# Patient Record
Sex: Female | Born: 2000 | Hispanic: No | Marital: Single | Smoking: Former smoker
Health system: Southern US, Community
[De-identification: ages and names within clinical notes are randomized; demographics above are authoritative.]

## PROBLEM LIST (undated history)

## (undated) DIAGNOSIS — G43909 Migraine, unspecified, not intractable, without status migrainosus: Secondary | ICD-10-CM

## (undated) DIAGNOSIS — T7840XA Allergy, unspecified, initial encounter: Secondary | ICD-10-CM

## (undated) DIAGNOSIS — J45909 Unspecified asthma, uncomplicated: Secondary | ICD-10-CM

## (undated) DIAGNOSIS — F3181 Bipolar II disorder: Secondary | ICD-10-CM

## (undated) DIAGNOSIS — T1491XA Suicide attempt, initial encounter: Secondary | ICD-10-CM

## (undated) HISTORY — PX: FOOT SURGERY: SHX648

## (undated) HISTORY — DX: Unspecified asthma, uncomplicated: J45.909

## (undated) HISTORY — PX: TONSILLECTOMY: SUR1361

## (undated) HISTORY — PX: WISDOM TOOTH EXTRACTION: SHX21

## (undated) HISTORY — DX: Allergy, unspecified, initial encounter: T78.40XA

---
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
## 2018-02-09 ENCOUNTER — Encounter: Payer: Self-pay | Admitting: Family Medicine

## 2018-02-09 ENCOUNTER — Ambulatory Visit (INDEPENDENT_AMBULATORY_CARE_PROVIDER_SITE_OTHER): Payer: Medicaid Other | Admitting: Family Medicine

## 2018-02-09 VITALS — BP 136/78 | Ht 66.0 in | Wt 219.8 lb

## 2018-02-09 DIAGNOSIS — R21 Rash and other nonspecific skin eruption: Secondary | ICD-10-CM

## 2018-02-09 DIAGNOSIS — E282 Polycystic ovarian syndrome: Secondary | ICD-10-CM

## 2018-02-09 DIAGNOSIS — E119 Type 2 diabetes mellitus without complications: Secondary | ICD-10-CM | POA: Diagnosis not present

## 2018-02-09 MED ORDER — TRIAMCINOLONE ACETONIDE 0.1 % EX CREA: TOPICAL_CREAM | CUTANEOUS | 3 refills | Status: DC

## 2018-02-09 NOTE — Progress Notes (Signed)
   Subjective:    Patient ID: Carrie Wright, female    DOB: June 29, 2000, 18 y.o.   MRN: [id number]  HPI  Patient would like to discuss starting birth control pills.  Patient. states she has taken in the past but is currently not on them.  Results for orders placed or performed in visit on 09/16/17  POCT rapid strep A  Result Value Ref Range   Rapid Strep A Screen Negative Negative    Glu nubers still good    Off now using primarily for hormone reg  Patient's cycles are irregular at times heavily at times sleeping for long periods.  Would like him more normalized understandably.  With the prior birth control pill specifically Ortho-Cyclen there was no significant help.  MonoNessa was also given without any substantial help.  Patient would like to try another birth control pill. Pt did not notice any improvement, really did not seem to help this   Pt notes erratic and heavy  periosd   Sexually active but not wanting   Pt is lesbian    Not remembering   Hoping for an alternaitbe  Still on medicai d    Was washing dishes a lot and deloped eczema itchy hands intermittently.  Rash and intermittently.  Has a lot of irritant exposures.  Over-the-counter meds not helping.  Known history of PCOS.  Primary reason patient takes metformin.  Not watching her diet very well.  Plans to work harder on  occa mariquan use   Results for orders placed or performed in visit on 09/16/17  POCT rapid strep A  Result Value Ref Range   Rapid Strep A Screen Negative Negative       Patient is also having problems with her eczema.  Review of Systems Occasional headaches no chest pain no back pain no abdominal pain occasional diminished energy    Objective:   Physical Exam  Alert and oriented, vitals reviewed and stable, NAD ENT-TM's and ext canals WNL bilat via otoscopic exam Soft palate, tonsils and post pharynx WNL via oropharyngeal exam Neck-symmetric, no masses; thyroid  nonpalpable and nontender Pulmonary-no tachypnea or accessory muscle use; Clear without wheezes via auscultation Card--no abnrml murmurs, rhythm reg and rate WNL Carotid pulses symmetric, without bruits Obesity noted And substantial bilateral dermatitis     Assessment & Plan:  Impression 1 PCO S discussed.  Importance of compliance with medications and diet discussed.  Options for birth control pills discussed.  Will initiate.  Hopefully will help cycles.  2.  Hand dermatitis.  Occupational component.  Will add steroid cream.  Follow-up as scheduled  Type 2 diabetes prior A1c stable/diet exercise discussed  Greater than 50% of this 25 minute face to face visit was spent in counseling and discussion and coordination of care regarding the above diagnosis/diagnosies

## 2018-02-11 MED ORDER — ETHYNODIOL DIAC-ETH ESTRADIOL 1-35 MG-MCG PO TABS: 1.0000 | ORAL_TABLET | Freq: Every day | ORAL | 11 refills | Status: DC

## 2018-02-18 ENCOUNTER — Telehealth: Payer: Self-pay | Admitting: Family

## 2018-02-18 DIAGNOSIS — Z719 Counseling, unspecified: Secondary | ICD-10-CM

## 2018-02-18 NOTE — Progress Notes (Signed)
Based on what you shared with me it looks like you have a serious condition that should be evaluated in a face to face office visit.  NOTE: If you entered your credit card information for this eVisit, you will not be charged. You may see a "hold" on your card for the $30 but that hold will drop off and you will not have a charge processed.  If you are having a true medical emergency please call 911.  If you need an urgent face to face visit, [facility] has four urgent care centers for your convenience.  If you need care fast and have a high deductible or no insurance consider:   [email or web address] to reserve your spot online an avoid wait times  [facility] [address], [address] [place], [place] [address] 8 am to 8 pm Monday-Friday 10 am to 4 pm Saturday-Sunday *Across the street from [employer]  [facility]  [address]  [place], [address] 8 am to 5 pm Monday-Friday * In the [facility]   The following sites will take your  insurance:  . [facility]  [phone] Get Driving Directions Find a Provider at this Location  [address] [place], [place] [address] . 10 am to 8 pm Monday-Friday . 12 pm to 8 pm Saturday-Sunday   . [facility]  [phone] Get Driving Directions Find a Provider at this Location  [address], [address] [place], [place] [address] . 8 am to 8 pm Monday-Friday . 9 am to 6 pm Saturday . 11 am to 6 pm Sunday   . [facility] [place]  [phone] Get Driving Directions  [address] [place], [place] [address] . 8 am to 8 pm Monday-Friday . 8 am to 4 pm Saturday-Sunday   Your e-visit answers were reviewed by a board certified advanced clinical practitioner to complete your personal care plan.  Thank you for using e-Visits.  

## 2018-05-13 ENCOUNTER — Ambulatory Visit (INDEPENDENT_AMBULATORY_CARE_PROVIDER_SITE_OTHER): Payer: Medicaid Other | Admitting: Family Medicine

## 2018-05-13 ENCOUNTER — Other Ambulatory Visit: Payer: Self-pay

## 2018-05-13 DIAGNOSIS — H00036 Abscess of eyelid left eye, unspecified eyelid: Secondary | ICD-10-CM | POA: Diagnosis not present

## 2018-05-13 MED ORDER — CEPHALEXIN 500 MG PO CAPS: 500.0000 mg | ORAL_CAPSULE | Freq: Four times a day (QID) | ORAL | 0 refills | Status: DC

## 2018-05-13 NOTE — Progress Notes (Signed)
   Subjective:    Patient ID: Carrie Wright, female    DOB: October 11, 2000, 18 y.o.   MRN: [id number]  HPI Video visit Patient is having swelling and drainage from her left eye. Patient states it happened overnight. Patient reports no other symptoms. Left upper eyelid is swollen tender to the inner aspect some greenish drainage and watery drainage denies high fever or other symptoms currently Virtual Visit via Video Note  I connected with Carrie Wright on 05/13/18 at  3:00 PM EDT by a video enabled telemedicine application and verified that I am speaking with the correct person using two identifiers.  Location: Patient: home Provider: office   I discussed the limitations of evaluation and management by telemedicine and the availability of in person appointments. The patient expressed understanding and agreed to proceed.  History of Present Illness:    Observations/Objective:   Assessment and Plan:   Follow Up Instructions:    I discussed the assessment and treatment plan with the patient. The patient was provided an opportunity to ask questions and all were answered. The patient agreed with the plan and demonstrated an understanding of the instructions.   The patient was advised to call back or seek an in-person evaluation if the symptoms worsen or if the condition fails to improve as anticipated.  I provided 10 minutes of non-face-to-face time during this encounter.      Review of Systems     Objective:   Physical Exam  On visual exam her upper eyelid is swollen does not appear to be red subjectively it is tender on the inner aspect      Assessment & Plan:  Eyelid infection no obvious conjunctivitis should gradually get better it does not appear to be any type of periorbital cellulitis at this point Keflex for the next 7 days warm compresses update [place] if ongoing troubles

## 2018-06-09 ENCOUNTER — Ambulatory Visit (INDEPENDENT_AMBULATORY_CARE_PROVIDER_SITE_OTHER): Payer: Medicaid Other | Admitting: Family Medicine

## 2018-06-09 ENCOUNTER — Other Ambulatory Visit: Payer: Self-pay

## 2018-06-09 DIAGNOSIS — E119 Type 2 diabetes mellitus without complications: Secondary | ICD-10-CM

## 2018-06-09 DIAGNOSIS — E282 Polycystic ovarian syndrome: Secondary | ICD-10-CM | POA: Diagnosis not present

## 2018-06-09 DIAGNOSIS — Z79899 Other long term (current) drug therapy: Secondary | ICD-10-CM

## 2018-06-09 DIAGNOSIS — F3130 Bipolar disorder, current episode depressed, mild or moderate severity, unspecified: Secondary | ICD-10-CM | POA: Diagnosis not present

## 2018-06-09 MED ORDER — METFORMIN HCL 500 MG PO TABS: ORAL_TABLET | ORAL | 1 refills | Status: DC

## 2018-06-09 MED ORDER — CARIPRAZINE HCL 1.5 MG PO CAPS: 1.5000 mg | ORAL_CAPSULE | Freq: Every day | ORAL | 2 refills | Status: DC

## 2018-06-09 NOTE — Progress Notes (Signed)
   Subjective:    Patient ID: Carrie Wright, female    DOB: 11/11/2000, 18 y.o.   MRN: [id number] Audio plus visual  Patient present with multiple concerns. Diabetes  She presents for her follow-up diabetic visit. She has type 2 diabetes mellitus. Risk factors for coronary artery disease include diabetes mellitus. Current diabetic treatment includes oral agent (monotherapy). She is compliant with treatment all of the time. Her weight is stable. She is following a diabetic diet.   Patient states she feels like she is having problems with focus and may have ADHD.  Patient has known history of bipolar disease.  We extended her psychiatric medications as a courtesy last fall because her psychiatrist and been was not accepting Medicaid.  Patient stated by January she would have new provider.  She continues to take medication that we have prescribed.  Now ongoing issues of challenges with attention and focusing.  No suicidal or homicidal thoughts   Patient would also like to discuss getting tested for cushing's disease.  Patient has been reading up on this.  She is fairly convinced that she has it.  Requests testing for it.  Virtual Visit via Video Note  I connected with Carrie Wright on 06/09/18 at  9:30 AM EDT by a video enabled telemedicine application and verified that I am speaking with the correct person using two identifiers.  Location: Patient: home Provider: office  I discussed the limitations of evaluation and management by telemedicine and the availability of in person appointments. The patient expressed understanding and agreed to proceed.  History of Present Illness:    Observations/Objective:   Assessment and Plan:   Follow Up Instructions:    I discussed the assessment and treatment plan with the patient. The patient was provided an opportunity to ask questions and all were answered. The patient agreed with the plan and demonstrated an understanding of the instructions.    The patient was advised to call back or seek an in-person evaluation if the symptoms worsen or if the condition fails to improve as anticipated.  I provided 25 minutes of non-face-to-face time during this encounter.         Review of Systems No vomiting no diarrhea no headache no chest pain    Objective:   Physical Exam  Virtual visit      Assessment & Plan:  Impression 1 type 2 diabetes discussed to maintain same meds diet exercise discussed  2.  PCOS.  Ongoing.  Patient compliant with medication.  Still irregular periods at times.  3.  Bipolar disease.  With extenuating symptomatology.  Long discussion held.  Absolutely needs a psychiatrist we will work on this once again.  Sorry state of affairs that many are not accepting Medicaid but will need to try to get her 1 rationale discussed

## 2018-06-29 ENCOUNTER — Encounter: Payer: Self-pay | Admitting: Family Medicine

## 2018-07-17 IMAGING — DX DG FOREARM 2V*R*
2 series · 2 of 2 positions shown · non-contrast
Comparison: None.

ADDENDUM:
On further review, there is a possible fracture of the RIGHT radial
neck. Recommend dedicated elbow radiographs.
CLINICAL DATA: Fall while skateboarding onto outstretched arms.
Bilateral arm and wrist pain.

EXAM:
RIGHT FOREARM - 2 VIEW

[forearm ap]
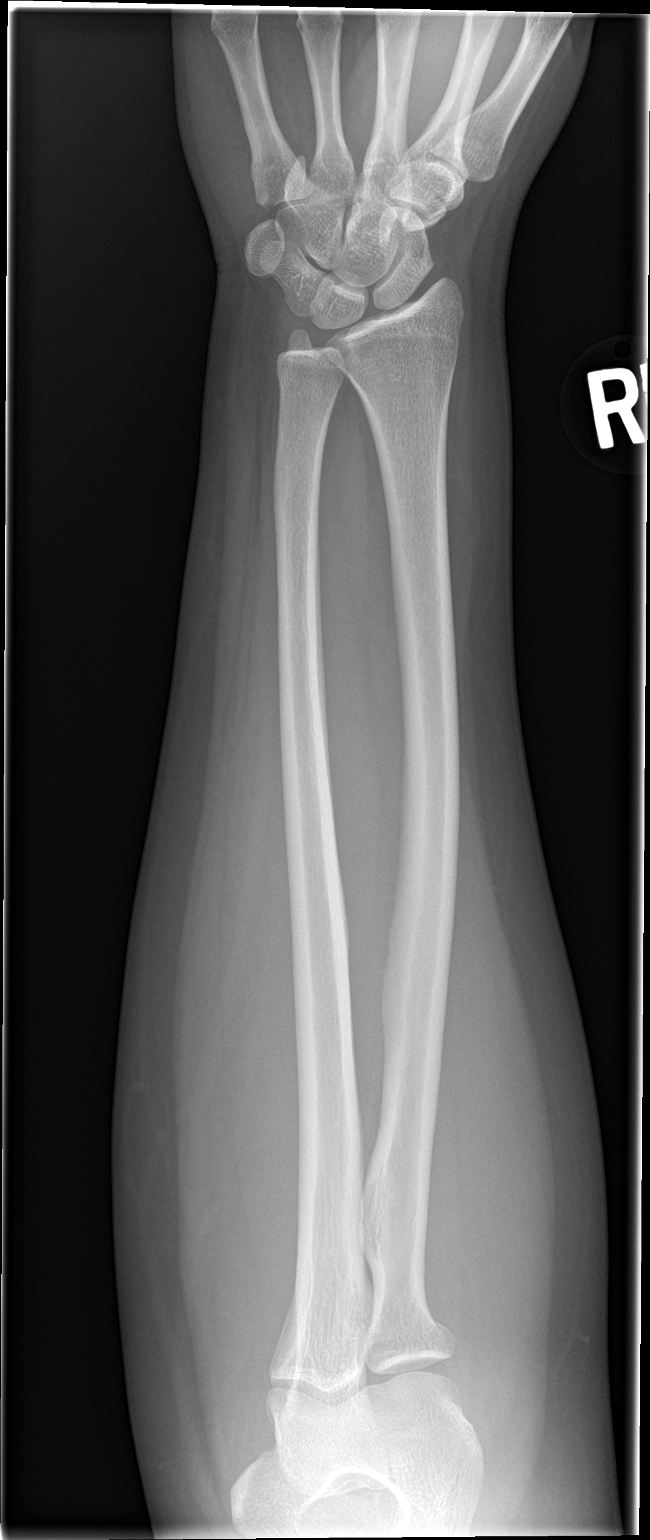

[forearm lat]
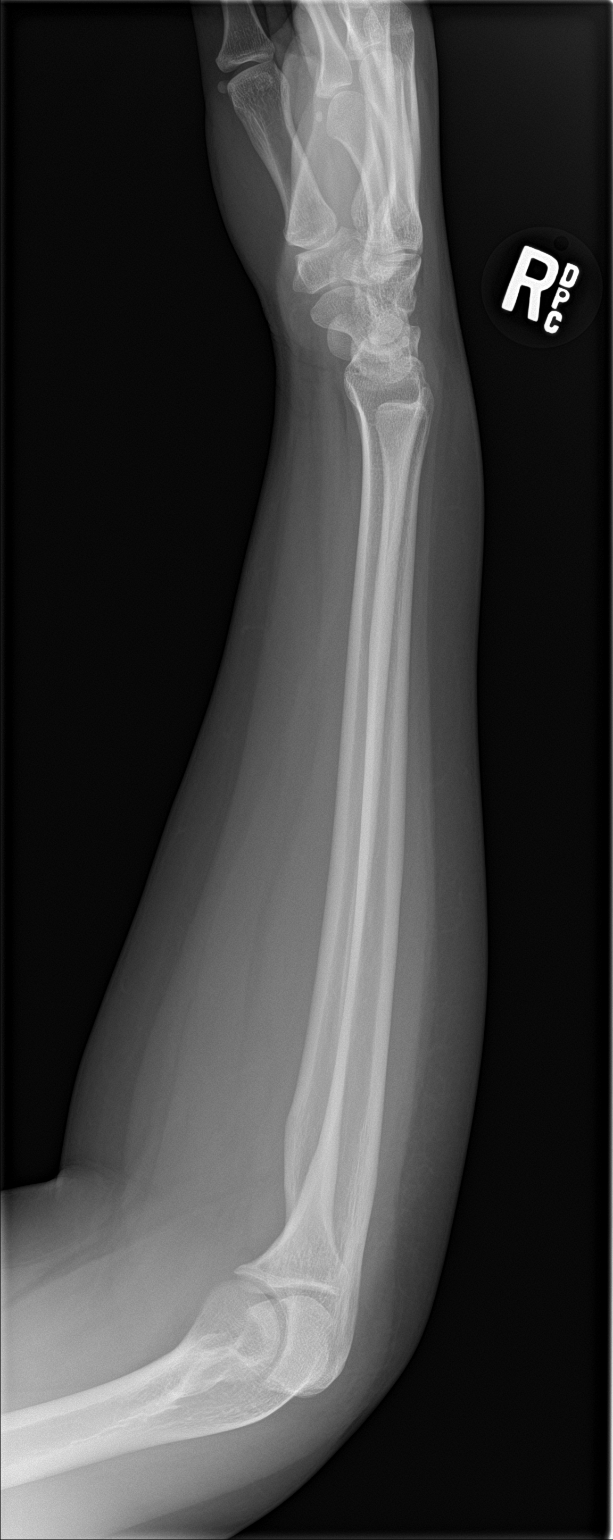

[2 of 2 positions shown; findings below may reference images not displayed]

FINDINGS: Cortical margins of the radius non are intact. There is no evidence
of fracture or other focal bone lesions. Elbow alignment is
maintained. Soft tissues are unremarkable.
IMPRESSION: Negative radiographs of the right forearm.

## 2018-07-17 IMAGING — DX DG WRIST COMPLETE 3+V*R*
4 series · 4 of 4 positions shown · non-contrast
Comparison: None.

CLINICAL DATA: Fall while skateboarding onto both outstretched
arms. Bilateral arm and wrist pain.

EXAM:
RIGHT WRIST - COMPLETE 3+ VIEW

[wrist pa]
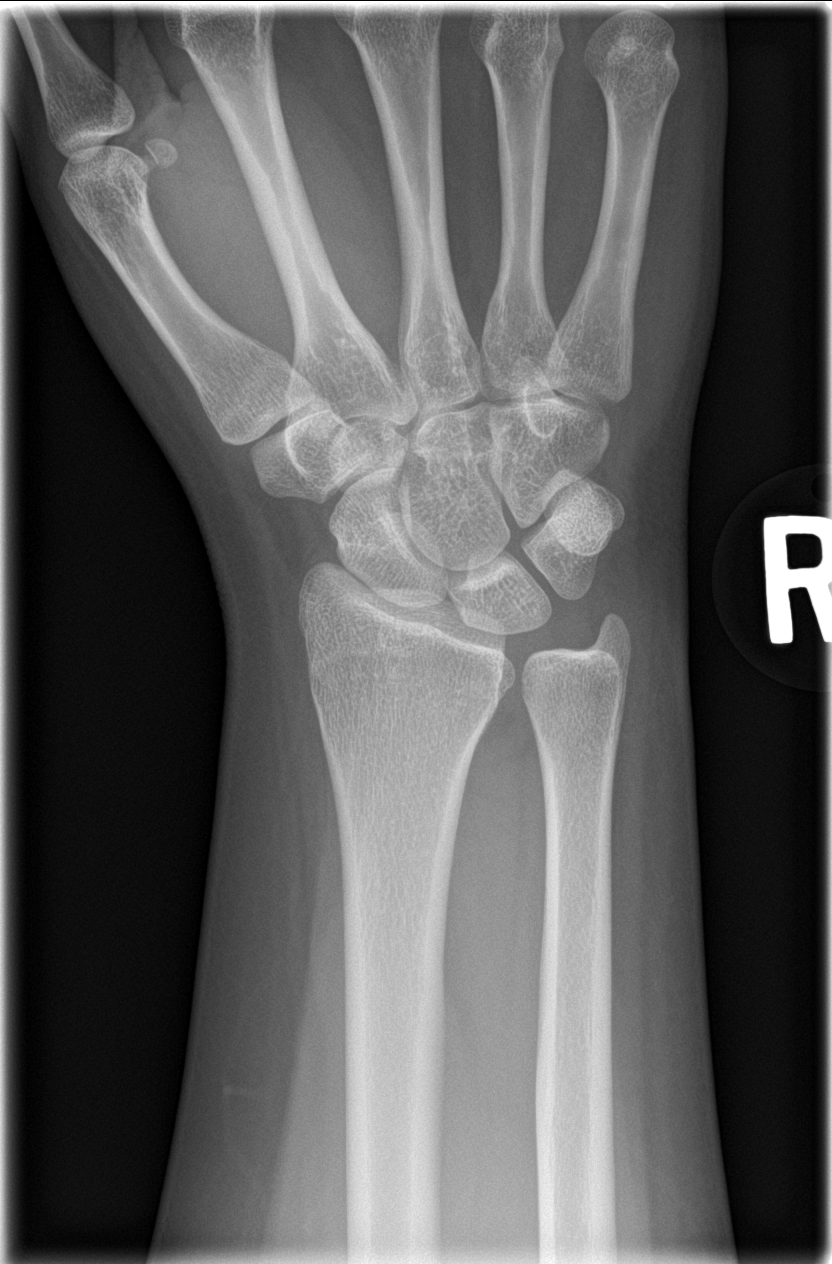

[wrist obl]
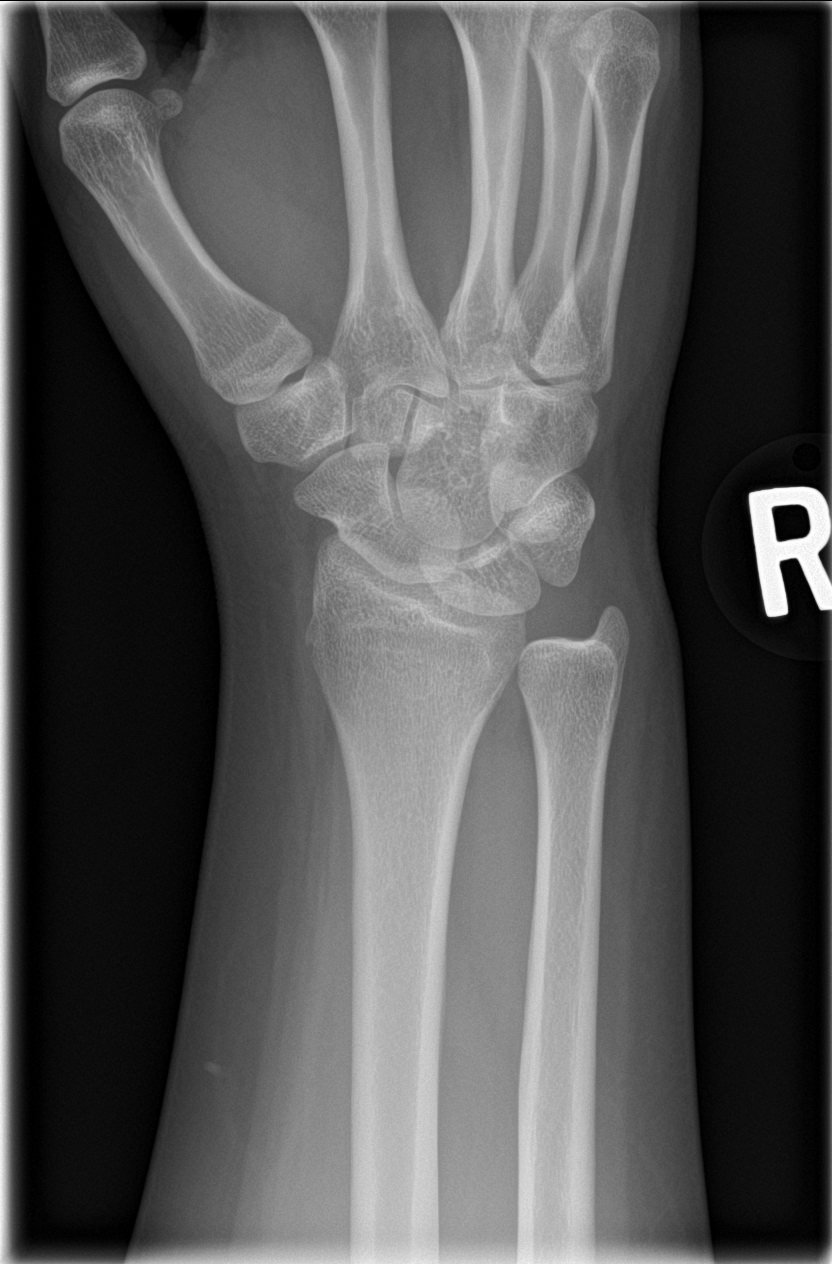

[wrist lat]
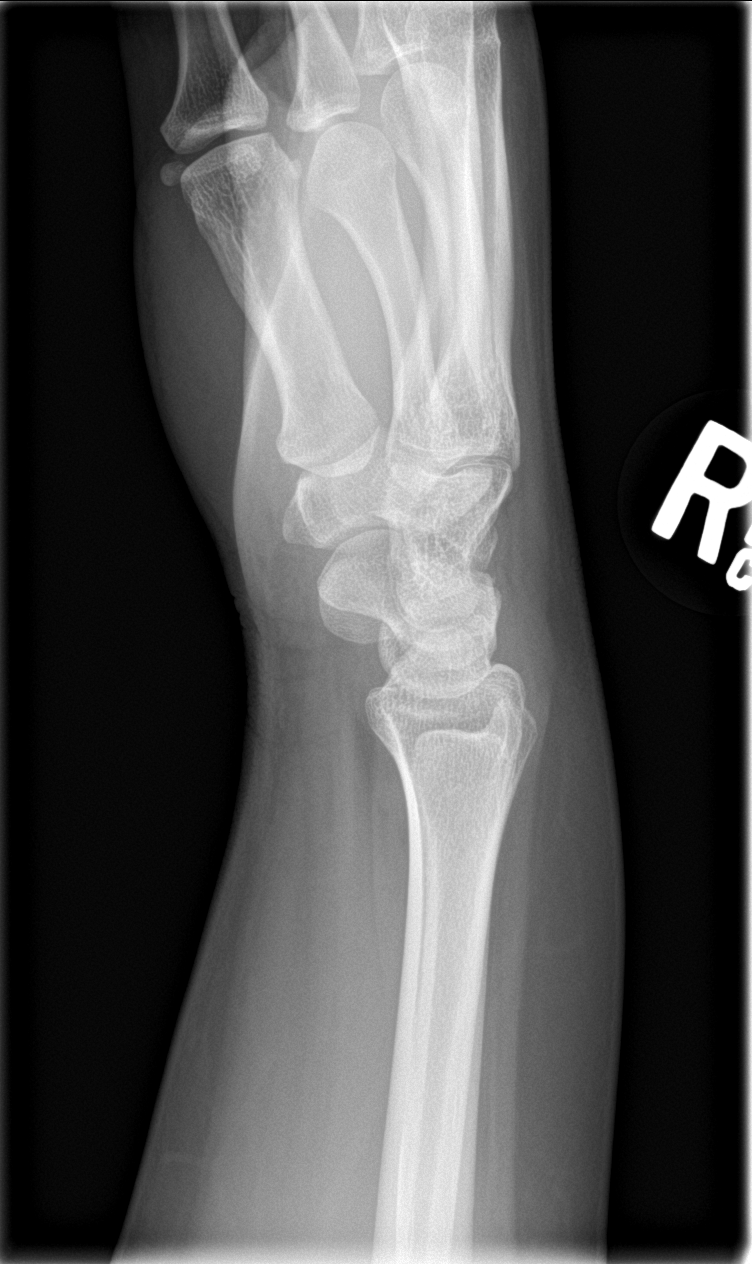

[wrist navicular]
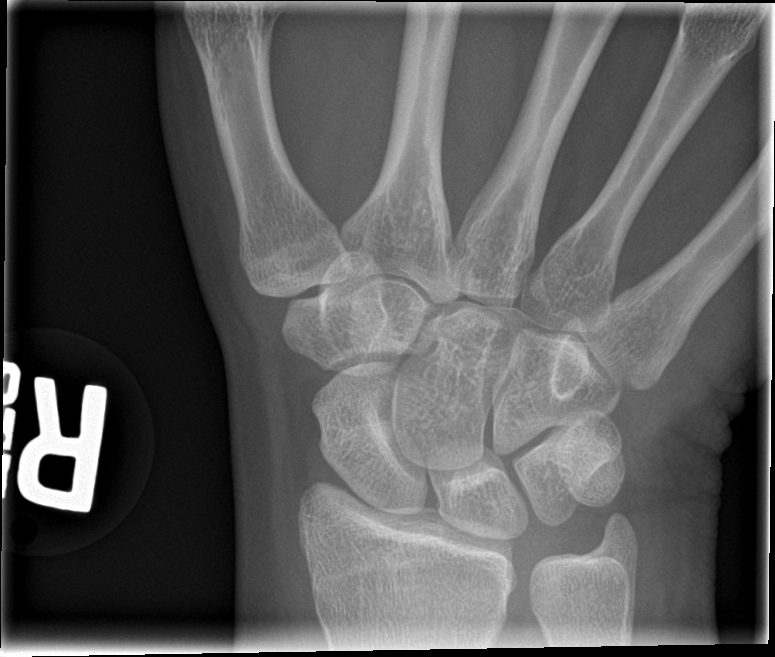

[4 of 4 positions shown; findings below may reference images not displayed]

FINDINGS: There is no evidence of fracture or dislocation. The scaphoid is
intact. There is no evidence of arthropathy or other focal bone
abnormality. Soft tissues are unremarkable.
IMPRESSION: Negative radiographs of the right wrist.

## 2018-08-27 ENCOUNTER — Other Ambulatory Visit: Payer: Self-pay | Admitting: Nurse Practitioner

## 2018-08-27 ENCOUNTER — Encounter: Payer: Self-pay | Admitting: Family Medicine

## 2018-08-30 ENCOUNTER — Other Ambulatory Visit: Payer: Self-pay | Admitting: Nurse Practitioner

## 2018-08-30 MED ORDER — DOXYCYCLINE HYCLATE 100 MG PO TABS: 100.0000 mg | ORAL_TABLET | Freq: Two times a day (BID) | ORAL | 2 refills | Status: DC

## 2018-09-03 ENCOUNTER — Other Ambulatory Visit: Payer: Self-pay

## 2018-09-03 ENCOUNTER — Ambulatory Visit (INDEPENDENT_AMBULATORY_CARE_PROVIDER_SITE_OTHER): Payer: 59 | Admitting: Nurse Practitioner

## 2018-09-03 DIAGNOSIS — F3130 Bipolar disorder, current episode depressed, mild or moderate severity, unspecified: Secondary | ICD-10-CM

## 2018-09-03 DIAGNOSIS — N926 Irregular menstruation, unspecified: Secondary | ICD-10-CM

## 2018-09-03 DIAGNOSIS — E282 Polycystic ovarian syndrome: Secondary | ICD-10-CM

## 2018-09-03 DIAGNOSIS — R69 Illness, unspecified: Secondary | ICD-10-CM | POA: Diagnosis not present

## 2018-09-03 MED ORDER — LO LOESTRIN FE 1 MG-10 MCG / 10 MCG PO TABS: 1.0000 | ORAL_TABLET | Freq: Every day | ORAL | 11 refills | Status: DC

## 2018-09-03 MED ORDER — CARIPRAZINE HCL 1.5 MG PO CAPS: 1.5000 mg | ORAL_CAPSULE | Freq: Every day | ORAL | 2 refills | Status: DC

## 2018-09-03 NOTE — Progress Notes (Signed)
PHONE VISIT Subjective:    Patient ID: Carrie Wright, female    DOB: [birth year], 18 y.o.   MRN: [id number]  HPI pt states she wants to discuss some mental health issues that she thinks may be ADD.  Virtual Visit via Telephone Note  I connected with Carrie Wright on 09/03/18 at  2:40 PM EDT by telephone and verified that I am speaking with the correct person using two identifiers.  Location: Patient: home Provider: office   I discussed the limitations, risks, security and privacy concerns of performing an evaluation and management service by telephone and the availability of in person appointments. I also discussed with the patient that there may be a patient responsible charge related to this service. The patient expressed understanding and agreed to proceed.   History of Present Illness: Presents for recheck on her mental health issues. C/o significant difficulty focusing. Has not been diagnosed with ADD/ADHD in the past. Is being treated for Bipolar I. Was referred to psychiatry in May. They called but patient did not follow up. Doing very well on Vraylar. Had one manic episode about a month ago lasting about 3 days which she attributes to excessive caffeine intake. Otherwise emotions have been stable. Also having irregular cycles. No recent intercourse. Denies possibility of pregnancy. PMH includes PCOS. Weight has fluctuated some; current weight around 214 lbs which is 5 lbs less than last visit.    Observations/Objective: Today's visit was via telephone Physical exam was not possible for this visit Alert, oriented. Thoughts logical, coherent and relevant. Cheerful affect.   Assessment and Plan: Problem List Items Addressed This Visit      Endocrine   PCOS (polycystic ovarian syndrome)     Other   Bipolar I disorder, most recent episode depressed (Carrie Wright) - Primary   Relevant Orders   Ambulatory referral to Psychiatry    Other Visit Diagnoses    Irregular menses           Meds ordered this encounter  Medications  . cariprazine (VRAYLAR) capsule    Sig: Take 1 capsule (1.5 mg total) by mouth daily.    Dispense:  30 capsule    Refill:  2    Order Specific Question:   Supervising Provider    Answer:   Sallee Lange A [[id number]]  . Norethindrone-Ethinyl Estradiol-Fe Biphas (LO LOESTRIN FE) 1 MG-10 MCG / 10 MCG tablet    Sig: Take 1 tablet by mouth daily. Start first Sunday after next cycle begins    Dispense:  1 Package    Refill:  11    Order Specific Question:   Supervising Provider    Answer:   Sallee Lange A [[id number]]     Follow Up Instructions: Will continue Vraylar for now until patient can see psychiatry. Referral resubmitted to system. Start oc's as directed to help regulate cycle. A copy of adult ADD screening was mailed to patient to complete and return to office.  Return for Recheck if no visit with psychiatry within next 3 months.    I discussed the assessment and treatment plan with the patient. The patient was provided an opportunity to ask questions and all were answered. The patient agreed with the plan and demonstrated an understanding of the instructions.   The patient was advised to call back or seek an in-person evaluation if the symptoms worsen or if the condition fails to improve as anticipated.  I provided 15 minutes of non-face-to-face time during this encounter.  Review of Systems     Objective:   Physical Exam        Assessment & Plan:

## 2018-09-04 ENCOUNTER — Encounter: Payer: Self-pay | Admitting: Nurse Practitioner

## 2018-09-06 ENCOUNTER — Encounter: Payer: Self-pay | Admitting: Family Medicine

## 2018-10-11 ENCOUNTER — Encounter: Payer: Self-pay | Admitting: Nurse Practitioner

## 2018-11-08 ENCOUNTER — Encounter (HOSPITAL_COMMUNITY): Payer: Self-pay

## 2018-11-25 ENCOUNTER — Ambulatory Visit (INDEPENDENT_AMBULATORY_CARE_PROVIDER_SITE_OTHER): Payer: 59 | Admitting: Psychiatry

## 2018-11-25 ENCOUNTER — Encounter (HOSPITAL_COMMUNITY): Payer: Self-pay | Admitting: Psychiatry

## 2018-11-25 ENCOUNTER — Other Ambulatory Visit: Payer: Self-pay

## 2018-11-25 DIAGNOSIS — F3130 Bipolar disorder, current episode depressed, mild or moderate severity, unspecified: Secondary | ICD-10-CM | POA: Diagnosis not present

## 2018-11-25 DIAGNOSIS — R69 Illness, unspecified: Secondary | ICD-10-CM | POA: Diagnosis not present
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
## 2018-12-13 ENCOUNTER — Ambulatory Visit (INDEPENDENT_AMBULATORY_CARE_PROVIDER_SITE_OTHER): Payer: 59 | Admitting: Psychiatry

## 2018-12-13 ENCOUNTER — Other Ambulatory Visit: Payer: Self-pay

## 2018-12-13 DIAGNOSIS — F3130 Bipolar disorder, current episode depressed, mild or moderate severity, unspecified: Secondary | ICD-10-CM | POA: Diagnosis not present

## 2018-12-13 DIAGNOSIS — R69 Illness, unspecified: Secondary | ICD-10-CM | POA: Diagnosis not present
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
## 2018-12-20 ENCOUNTER — Ambulatory Visit (HOSPITAL_COMMUNITY): Payer: Medicaid Other | Admitting: Psychiatry

## 2018-12-20 ENCOUNTER — Telehealth (HOSPITAL_COMMUNITY): Payer: Self-pay | Admitting: Psychiatry

## 2018-12-20 ENCOUNTER — Other Ambulatory Visit: Payer: Self-pay

## 2018-12-20 NOTE — Telephone Encounter (Signed)
Therapist attempted to contact patient twice via text message through  [email or web address] platform, no response.  Therapist called patient and left message indicating attempt and requesting patient call the office.

## 2019-02-04 ENCOUNTER — Other Ambulatory Visit: Payer: Self-pay

## 2019-02-04 ENCOUNTER — Ambulatory Visit (INDEPENDENT_AMBULATORY_CARE_PROVIDER_SITE_OTHER): Payer: 59 | Admitting: Psychiatry

## 2019-02-04 DIAGNOSIS — F3130 Bipolar disorder, current episode depressed, mild or moderate severity, unspecified: Secondary | ICD-10-CM | POA: Diagnosis not present

## 2019-02-04 DIAGNOSIS — R69 Illness, unspecified: Secondary | ICD-10-CM | POA: Diagnosis not present
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
## 2019-02-09 ENCOUNTER — Encounter: Payer: Self-pay | Admitting: Family Medicine

## 2019-02-10 ENCOUNTER — Encounter: Payer: Self-pay | Admitting: Family Medicine

## 2019-02-10 ENCOUNTER — Other Ambulatory Visit: Payer: Self-pay

## 2019-02-10 ENCOUNTER — Ambulatory Visit (INDEPENDENT_AMBULATORY_CARE_PROVIDER_SITE_OTHER): Payer: 59 | Admitting: Psychiatry

## 2019-02-10 DIAGNOSIS — R69 Illness, unspecified: Secondary | ICD-10-CM | POA: Diagnosis not present

## 2019-02-10 DIAGNOSIS — F3130 Bipolar disorder, current episode depressed, mild or moderate severity, unspecified: Secondary | ICD-10-CM
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
## 2019-02-23 ENCOUNTER — Other Ambulatory Visit: Payer: Self-pay

## 2019-02-23 ENCOUNTER — Ambulatory Visit (HOSPITAL_COMMUNITY): Payer: 59 | Admitting: Psychiatry

## 2019-03-09 ENCOUNTER — Telehealth (HOSPITAL_COMMUNITY): Payer: Self-pay | Admitting: Psychiatry

## 2019-03-09 ENCOUNTER — Other Ambulatory Visit: Payer: Self-pay

## 2019-03-09 ENCOUNTER — Ambulatory Visit (HOSPITAL_COMMUNITY): Payer: 59 | Admitting: Psychiatry

## 2019-03-09 NOTE — Telephone Encounter (Signed)
Therapist attempted to contact patient twice via text through [email or web address] platform, no response.  Therapist called patient and left message indicating attempt and requesting patient call office. 

## 2019-03-23 ENCOUNTER — Other Ambulatory Visit: Payer: Self-pay

## 2019-03-23 ENCOUNTER — Telehealth (HOSPITAL_COMMUNITY): Payer: Self-pay | Admitting: Psychiatry

## 2019-03-23 ENCOUNTER — Ambulatory Visit (HOSPITAL_COMMUNITY): Payer: 59 | Admitting: Psychiatry

## 2019-03-23 NOTE — Telephone Encounter (Signed)
Therapist called patient for scheduled appointment.  However, patient was in route to work.  Therapist and patient agreed to reschedule appointment.  Therapist also reminded patient of no-show/attendance policy.

## 2019-04-06 ENCOUNTER — Ambulatory Visit (HOSPITAL_COMMUNITY): Payer: 59 | Admitting: Psychiatry

## 2019-04-25 DIAGNOSIS — H5210 Myopia, unspecified eye: Secondary | ICD-10-CM | POA: Diagnosis not present

## 2019-05-04 ENCOUNTER — Telehealth (HOSPITAL_COMMUNITY): Payer: Self-pay | Admitting: Psychiatry

## 2019-05-04 ENCOUNTER — Telehealth (HOSPITAL_COMMUNITY): Payer: 59 | Admitting: Psychiatry

## 2019-05-04 ENCOUNTER — Other Ambulatory Visit: Payer: Self-pay

## 2019-05-04 NOTE — Telephone Encounter (Signed)
Therapist attempted to contact patient twice via text through [employer] platform, no response.  Therapist called patient and left message indicating attempt and requesting patient call office. 

## 2019-06-07 ENCOUNTER — Encounter: Payer: Self-pay | Admitting: Family Medicine

## 2019-06-07 ENCOUNTER — Telehealth (INDEPENDENT_AMBULATORY_CARE_PROVIDER_SITE_OTHER): Payer: 59 | Admitting: Family Medicine

## 2019-06-07 ENCOUNTER — Other Ambulatory Visit: Payer: Self-pay

## 2019-06-07 DIAGNOSIS — J309 Allergic rhinitis, unspecified: Secondary | ICD-10-CM | POA: Diagnosis not present

## 2019-06-07 DIAGNOSIS — J209 Acute bronchitis, unspecified: Secondary | ICD-10-CM

## 2019-06-07 NOTE — Progress Notes (Signed)
Patient ID: Carrie Wright, female    DOB: [birth year], 19 y.o.   MRN: [id number]  Virtual Visit via Telephone Note  I connected with Carrie Wright on 06/08/19 at  3:30 PM EDT by telephone and verified that I am speaking with the correct person using two identifiers.  Location: Patient: home Provider: office   I discussed the limitations, risks, security and privacy concerns of performing an evaluation and management service by telephone and the availability of in person appointments. I also discussed with the patient that there may be a patient responsible charge related to this service. The patient expressed understanding and agreed to proceed.    Chief Complaint  Patient presents with  . Cough    fever, sore throat, congestion since yesterday   Subjective:    HPI  Pt had phone visit concerning for runny nose, fatigue, sleeping a lot, itchy watery eyes.  Using benadryl and wal-phed. In past had h/o asthma and needed inhalers when she would get sick.  Not using them now. Working with the public. Entire family has been sick recently and mom had negative covid test per patient.  Not sure if having a fever, but felt warm yesterday.   Sore throat from coughing.  Small amt of productive cough with light yellow sputum. Coughing is worse at night.   Medical History Carrie Wright has a past medical history of Allergy, Asthma, Bipolar 2 disorder (HCC), Migraine, Migraine, and Suicidal behavior with attempted self-injury (HCC).   Outpatient Encounter Medications as of 06/07/2019  Medication Sig  . albuterol (VENTOLIN HFA) 108 (90 Base) MCG/ACT inhaler Inhale 2 puffs into the lungs every 6 (six) hours as needed for wheezing or shortness of breath (coughing).  . cariprazine (VRAYLAR) capsule Take 1 capsule (1.5 mg total) by mouth daily.  Marland Kitchen doxycycline (VIBRA-TABS) 100 MG tablet Take 1 tablet (100 mg total) by mouth 2 (two) times daily. (Patient not taking: Reported on 11/25/2018)  .  metFORMIN (GLUCOPHAGE) 500 MG tablet Take one tablet daily (Patient not taking: Reported on 11/25/2018)  . Norethindrone-Ethinyl Estradiol-Fe Biphas (LO LOESTRIN FE) 1 MG-10 MCG / 10 MCG tablet Take 1 tablet by mouth daily. Start first Sunday after next cycle begins (Patient not taking: Reported on 11/25/2018)  . predniSONE (DELTASONE) 20 MG tablet Take 2 tablets (40 mg total) by mouth daily with breakfast for 5 days.   No facility-administered encounter medications on file as of 06/07/2019.     Review of Systems  Constitutional: Positive for activity change and fatigue. Negative for chills and fever.  HENT: Positive for postnasal drip, rhinorrhea and sore throat. Negative for congestion, ear pain, sinus pressure and sinus pain.   Eyes: Negative for pain, discharge and itching.  Respiratory: Positive for cough. Negative for shortness of breath and wheezing.   Cardiovascular: Negative for chest pain.  Gastrointestinal: Negative for constipation, diarrhea, nausea and vomiting.     Vitals There were no vitals taken for this visit.  Objective:   Physical Exam  No PE due to phone visit.  Assessment and Plan   1. Bronchospasm with bronchitis, acute - predniSONE (DELTASONE) 20 MG tablet; Take 2 tablets (40 mg total) by mouth daily with breakfast for 5 days.  Dispense: 10 tablet; Refill: 0 - albuterol (VENTOLIN HFA) 108 (90 Base) MCG/ACT inhaler; Inhale 2 puffs into the lungs every 6 (six) hours as needed for wheezing or shortness of breath (coughing).  Dispense: 18 g; Refill: 0  2. Allergic rhinitis, unspecified seasonality, unspecified trigger  Likely allergic rhinitis vs. Viral syndrome. Advising to get covid testing.  Call with results and can given work note and instructions once results are back.  Advising quarantining till results back from covid testing. Symptomatic treatment given for fever and bodyaches with tylenol/ibuprofen and increase in fluids.   Take mucinex or  robitussin for coughing.  Use claritin or allegra for the runny nose and itchy watery eyes. Gave prednisone and albuterol for bronchospasm.  Call back if more SOB, coughing, persistent fever or not improving in next 5-7 days.   If positive for covid, requires quarantining for 10 days from onset of symptoms.  Pt in agreement with plan.    F/u prn.    Follow Up Instructions:    I discussed the assessment and treatment plan with the patient. The patient was provided an opportunity to ask questions and all were answered. The patient agreed with the plan and demonstrated an understanding of the instructions.   The patient was advised to call back or seek an in-person evaluation if the symptoms worsen or if the condition fails to improve as anticipated.  I provided 12 minutes of non-face-to-face time during this encounter.

## 2019-06-08 MED ORDER — ALBUTEROL SULFATE HFA 108 (90 BASE) MCG/ACT IN AERS: 2.0000 | INHALATION_SPRAY | Freq: Four times a day (QID) | RESPIRATORY_TRACT | 0 refills | Status: DC | PRN

## 2019-06-08 MED ORDER — PREDNISONE 20 MG PO TABS: 40.0000 mg | ORAL_TABLET | Freq: Every day | ORAL | 0 refills | Status: AC

## 2019-09-26 ENCOUNTER — Ambulatory Visit
Admission: RE | Admit: 2019-09-26 | Discharge: 2019-09-26 | Disposition: A | Discharge status: Home / Self Care | Payer: Commercial Indemnity | Source: Ambulatory Visit | Attending: Physician Assistant | Admitting: Physician Assistant

## 2019-09-26 ENCOUNTER — Other Ambulatory Visit: Payer: Self-pay

## 2019-09-26 VITALS — BP 135/89 | HR 63 | Temp 99.1°F | Resp 18 | Ht 65.0 in | Wt 210.0 lb

## 2019-09-26 DIAGNOSIS — L03311 Cellulitis of abdominal wall: Secondary | ICD-10-CM

## 2019-09-26 DIAGNOSIS — L02411 Cutaneous abscess of right axilla: Secondary | ICD-10-CM

## 2019-09-26 MED ORDER — MUPIROCIN 2 % EX OINT: 1.0000 "application " | TOPICAL_OINTMENT | Freq: Two times a day (BID) | CUTANEOUS | 0 refills | Status: AC

## 2019-09-26 MED ORDER — DOXYCYCLINE HYCLATE 100 MG PO CAPS: 100.0000 mg | ORAL_CAPSULE | Freq: Two times a day (BID) | ORAL | 0 refills | Status: AC

## 2019-09-26 NOTE — ED Provider Notes (Signed)
[facility]    CSN: [id number] Arrival date & time: 09/26/19  1603      History   Chief Complaint Chief Complaint  Patient presents with  . Appointment  . Abscess    HPI Carrie Wright is a 19 y.o. female.  19 year old female presents for 2 sores that she would like to have evaluated. She has a small open sore of the left lower abdomen that has been there for about a week. She says this area has drained blood and some pus. She said it is shrinking and seems to be getting better. She says a couple days ago she noticed a swollen red area of the right axilla that is tender. She says this lesion is starting similar to the way the abdominal lesion started. Patient says that she has been cleaning the areas with Hibiclens and applying Vaseline and Band-Aids. She denies any fevers she says she has had these lesions in the past, last time was about a year ago. She denies any history of MRSA. There are no other concerns today.  HPI  Past Medical History:  Diagnosis Date  . Allergy   . Asthma   . Bipolar 2 disorder (HCC)   . Migraine   . Migraine   . Suicidal behavior with attempted self-injury [facility])     Patient Active Problem List   Diagnosis Date Noted  . Type 2 diabetes mellitus without complications (HCC) 02/15/2017  . Anaphylaxis 01/15/2017  . Allergic reaction 01/15/2017  . Prediabetes 06/07/2016  . PCOS (polycystic ovarian syndrome) 07/10/2015  . Amenorrhea 07/10/2015  . Bipolar I disorder, most recent episode depressed (HCC) 05/13/2015  . Tylenol overdose 05/11/2015  . Suicide attempt by acetaminophen overdose (HCC) 05/11/2015  . Generalized anxiety disorder 06/29/2014  . Depression 04/04/2013  . Morbid obesity (HCC) 04/04/2013  . Migraine with aura 05/26/2012  . Episodic tension type headache 05/26/2012  . Acquired acanthosis nigricans 05/26/2012  . 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 Migraine without aura 02/25/2012  . Acanthosis  nigricans 02/25/2012  . Migraine, unspecified, without mention of intractable migraine with status migrainosus 02/24/2012    Past Surgical History:  Procedure Laterality Date  . 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 TONSILLECTOMY     2007    OB History   No obstetric history on file.      Home Medications    Prior to Admission medications   Medication Sig Start Date End Date Taking? Authorizing Provider  albuterol (VENTOLIN HFA) 108 (90 Base) MCG/ACT inhaler Inhale 2 puffs into the lungs every 6 (six) hours as needed for wheezing or shortness of breath (coughing). 06/08/19  Yes Ladona Ridgel, Malena M, DO  cariprazine (VRAYLAR) capsule Take 1 capsule (1.5 mg total) by mouth daily. 09/03/18   Campbell Riches, NP  doxycycline (VIBRAMYCIN) 100 MG capsule Take 1 capsule (100 mg total) by mouth 2 (two) times daily for 7 days. 09/26/19 10/03/19  Eusebio Friendly B, PA-C  metFORMIN (GLUCOPHAGE) 500 MG tablet Take one tablet daily Patient not taking: Reported on 11/25/2018 06/09/18   Merlyn Albert, MD  mupirocin ointment (BACTROBAN) 2 % Apply 1 application topically 2 (two) times daily for 7 days. 09/26/19 10/03/19  Eusebio Friendly B, PA-C  Norethindrone-Ethinyl Estradiol-Fe Biphas (LO LOESTRIN FE) 1 MG-10 MCG / 10 MCG tablet Take 1 tablet by mouth daily. Start first Sunday after next cycle begins Patient not taking: Reported on 11/25/2018 09/03/18   Sherie Don  C, NP    Family History Family History  Problem Relation Age of Onset  . Migraines Father   . Migraines Maternal Grandmother   . Cancer Mother   . Migraines Mother   . Depression Mother   . Schizophrenia Maternal Grandfather     Social History Social History   Tobacco Use  . Smoking status: Current Every Day Smoker    Packs/day: 0.25    Years: 3.00    Pack years: 0.75    Types: Cigarettes  . Smokeless tobacco: Never Used  Vaping Use  . Vaping Use: Never used  Substance Use Topics  . Alcohol use: Yes     Alcohol/week: 5.0 standard drinks    Types: 5 Standard drinks or equivalent per week    Comment: occasionally   . Drug use: Yes    Types: Marijuana    Comment: 3.5 grams per week (3 times per week)     Allergies   Gardasil [hpv 4-valent vaccine recombinant vaccine]   Review of Systems Review of Systems  Constitutional: Negative for fatigue and fever.  Gastrointestinal: Negative for abdominal pain, nausea and vomiting.  Musculoskeletal: Negative for arthralgias and myalgias.  Skin: Positive for color change and wound. Negative for rash.  Allergic/Immunologic: Negative for immunocompromised state.  Neurological: Negative for weakness.  Hematological: Negative for adenopathy.     Physical Exam Triage Vital Signs ED Triage Vitals  Enc Vitals Group     BP 09/26/19 1637 135/89     Pulse Rate 09/26/19 1637 63     Resp 09/26/19 1637 18     Temp 09/26/19 1637 99.1 F (37.3 C)     Temp Source 09/26/19 1637 Oral     SpO2 09/26/19 1637 100 %     Weight 09/26/19 1635 210 lb (95.3 kg)     Height 09/26/19 1635 5\' 5"  (1.651 m)     Head Circumference --      Peak Flow --      Pain Score 09/26/19 1634 5     Pain Loc --      Pain Edu? --      Excl. in GC? --    No data found.  Updated Vital Signs BP 135/89 (BP Location: Left Arm)   Pulse 63   Temp 99.1 F (37.3 C) (Oral)   Resp 18   Ht 5\' 5"  (1.651 m)   Wt 210 lb (95.3 kg)   LMP 09/12/2019 (Approximate)   SpO2 100%   BMI 34.95 kg/m        Physical Exam Vitals and nursing note reviewed.  Constitutional:      General: She is not in acute distress.    Appearance: Normal appearance. She is obese. She is not ill-appearing or toxic-appearing.  HENT:     Head: Normocephalic and atraumatic.  Eyes:     General: No scleral icterus.       Right eye: No discharge.        Left eye: No discharge.     Conjunctiva/sclera: Conjunctivae normal.  Cardiovascular:     Rate and Rhythm: Normal rate and regular rhythm.     Heart  sounds: Normal heart sounds.  Pulmonary:     Effort: Pulmonary effort is normal. No respiratory distress.     Breath sounds: Normal breath sounds.  Musculoskeletal:     Cervical back: Neck supple.  Skin:    General: Skin is dry.     Comments: Small indurated erythematous area, approximately 2 cm in  length and 1 cm in width of the left lower abdomen.  There is a central opening but no current drainage.  Area is tender to palpation.  There is no fluctuance.  There is another small approximately 6 mm in diameter erythematous papule of the right axilla that is tender to palpation.  No fluctuance.  No drainage.  Neurological:     General: No focal deficit present.     Mental Status: She is alert. Mental status is at baseline.     Motor: No weakness.     Gait: Gait normal.  Psychiatric:        Mood and Affect: Mood normal.        Behavior: Behavior normal.        Thought Content: Thought content normal.      UC Treatments / Results  Labs (all labs ordered are listed, but only abnormal results are displayed) Labs Reviewed - No data to display  EKG   Radiology No results found.  Procedures Procedures (including critical care time)  Medications Ordered in UC Medications - No data to display  Initial Impression / Assessment and Plan / UC Course  I have reviewed the triage vital signs and the nursing notes.  Pertinent labs & imaging results that were available during my care of the patient were reviewed by me and considered in my medical decision making (see chart for details).   Patient is exam consistent with cellulitis multiple areas.  Prescribed doxycycline to patient.  Also sent mupirocin ointment for her.  Advised patient to keep good wound care and follow-up with our clinic or PCP as needed for any worsening of symptoms or if this is recurrent problem.  Follow-up sooner if there are are any fevers or significant acute worsening.   Final Clinical Impressions(s) / UC  Diagnoses   Final diagnoses:  Cellulitis of abdominal wall  Abscess of right axilla     Discharge Instructions     Take antibiotics as prescribed. Keep the areas clean and dry. In clean with the Hibiclens. You can then apply mupirocin a couple times a day afterwards. Your infection should start to clear up after couple days, but finish the entire prescription of the antibiotic. If at any point you have a fever, more lesions show up or you have increased pain, you should come back for another evaluation. If acute illness and fever, go to the emergency room.    ED Prescriptions    Medication Sig Dispense Auth. Provider   doxycycline (VIBRAMYCIN) 100 MG capsule Take 1 capsule (100 mg total) by mouth 2 (two) times daily for 7 days. 14 capsule Eusebio Friendly B, PA-C   mupirocin ointment (BACTROBAN) 2 % Apply 1 application topically 2 (two) times daily for 7 days. 22 g Shirlee Latch, PA-C     PDMP not reviewed this encounter.   Shirlee Latch, PA-C 09/27/19 1434

## 2019-09-26 NOTE — ED Triage Notes (Signed)
Patient in today c/o abscess on the left side of her abdomen x 1 week and another under her right arm 2-3 days ago and another one mid-abdomen x 1 day. Patient has been cleaning with Hibiclens and applying Vaseline.

## 2019-09-26 NOTE — Discharge Instructions (Signed)
Take antibiotics as prescribed. Keep the areas clean and dry. In clean with the Hibiclens. You can then apply mupirocin a couple times a day afterwards. Your infection should start to clear up after couple days, but finish the entire prescription of the antibiotic. If at any point you have a fever, more lesions show up or you have increased pain, you should come back for another evaluation. If acute illness and fever, go to the emergency room.

## 2020-02-19 ENCOUNTER — Ambulatory Visit (HOSPITAL_COMMUNITY)
Admission: EM | Admit: 2020-02-19 | Discharge: 2020-02-19 | Disposition: A | Discharge status: Home / Self Care | Payer: 59 | Attending: Urgent Care | Admitting: Urgent Care

## 2020-02-19 ENCOUNTER — Other Ambulatory Visit: Payer: Self-pay

## 2020-02-19 ENCOUNTER — Encounter (HOSPITAL_COMMUNITY): Payer: Self-pay

## 2020-02-19 DIAGNOSIS — R059 Cough, unspecified: Secondary | ICD-10-CM | POA: Diagnosis not present

## 2020-02-19 DIAGNOSIS — J45909 Unspecified asthma, uncomplicated: Secondary | ICD-10-CM | POA: Diagnosis not present

## 2020-02-19 DIAGNOSIS — R0602 Shortness of breath: Secondary | ICD-10-CM | POA: Diagnosis present

## 2020-02-19 DIAGNOSIS — Z79899 Other long term (current) drug therapy: Secondary | ICD-10-CM | POA: Insufficient documentation

## 2020-02-19 DIAGNOSIS — B349 Viral infection, unspecified: Secondary | ICD-10-CM | POA: Insufficient documentation

## 2020-02-19 DIAGNOSIS — F3181 Bipolar II disorder: Secondary | ICD-10-CM | POA: Diagnosis not present

## 2020-02-19 DIAGNOSIS — Z20822 Contact with and (suspected) exposure to covid-19: Secondary | ICD-10-CM | POA: Insufficient documentation

## 2020-02-19 DIAGNOSIS — F17210 Nicotine dependence, cigarettes, uncomplicated: Secondary | ICD-10-CM | POA: Diagnosis not present

## 2020-02-19 DIAGNOSIS — J209 Acute bronchitis, unspecified: Secondary | ICD-10-CM | POA: Diagnosis present

## 2020-02-19 LAB — SARS CORONAVIRUS 2 (TAT 6-24 HRS): SARS Coronavirus 2: NEGATIVE

## 2020-02-19 MED ORDER — BENZONATATE 100 MG PO CAPS: 100.0000 mg | ORAL_CAPSULE | Freq: Three times a day (TID) | ORAL | 0 refills | Status: DC | PRN

## 2020-02-19 MED ORDER — ALBUTEROL SULFATE HFA 108 (90 BASE) MCG/ACT IN AERS: 2.0000 | INHALATION_SPRAY | Freq: Four times a day (QID) | RESPIRATORY_TRACT | 0 refills | Status: DC | PRN

## 2020-02-19 MED ORDER — PROMETHAZINE-DM 6.25-15 MG/5ML PO SYRP: 5.0000 mL | ORAL_SOLUTION | Freq: Every evening | ORAL | 0 refills | Status: DC | PRN

## 2020-02-19 NOTE — ED Triage Notes (Addendum)
Pt in with c/o sob and dry cough that has been going on for 1 week now  Pt has not had medication for sxs   Bilateral lung sounds clear

## 2020-02-19 NOTE — ED Provider Notes (Signed)
[facility]   MRN: [id number] DOB: [birth year]  Subjective:   Carrie Wright is a 20 y.o. female presenting for 1 week history of persistent shob, cough, right sided back pain. Has a history of asthma, is not using her inhaler. Has not tried medications for symptoms. Denies fever, chest pain, no urinary symptoms. She is COVID vaccinated. No booster. Not a smoker.   No current facility-administered medications for this encounter.  Current Outpatient Medications:  .  benzonatate (TESSALON) 100 MG capsule, Take 1-2 capsules (100-200 mg total) by mouth 3 (three) times daily as needed., Disp: 60 capsule, Rfl: 0 .  promethazine-dextromethorphan (PROMETHAZINE-DM) 6.25-15 MG/5ML syrup, Take 5 mLs by mouth at bedtime as needed for cough., Disp: 100 mL, Rfl: 0 .  albuterol (VENTOLIN HFA) 108 (90 Base) MCG/ACT inhaler, Inhale 2 puffs into the lungs every 6 (six) hours as needed for wheezing or shortness of breath (coughing)., Disp: 18 g, Rfl: 0 .  cariprazine (VRAYLAR) capsule, Take 1 capsule (1.5 mg total) by mouth daily., Disp: 30 capsule, Rfl: 2 .  metFORMIN (GLUCOPHAGE) 500 MG tablet, Take one tablet daily (Patient not taking: No sig reported), Disp: 90 tablet, Rfl: 1 .  Norethindrone-Ethinyl Estradiol-Fe Biphas (LO LOESTRIN FE) 1 MG-10 MCG / 10 MCG tablet, Take 1 tablet by mouth daily. Start first Sunday after next cycle begins (Patient not taking: Reported on 11/25/2018), Disp: 1 Package, Rfl: 11   Allergies  Allergen Reactions  . Gardasil [Hpv 4-Valent Vaccine Recombinant Vaccine] Rash    Past Medical History:  Diagnosis Date  . Allergy   . Asthma   . Bipolar 2 disorder (HCC)   . Migraine   . Migraine   . Suicidal behavior with attempted self-injury (HCC)      Past Surgical History:  Procedure Laterality Date  . 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 TONSILLECTOMY     18 07    Family History  Problem Relation Age of Onset  . Migraines Father   .  Migraines Maternal Grandmother   . Cancer Mother   . Migraines Mother   . Depression Mother   . Schizophrenia Maternal Grandfather     Social History   Tobacco Use  . Smoking status: Current Every Day Smoker    Packs/day: 0.25    Years: 3.00    Pack years: 0.75    Types: Cigarettes  . Smokeless tobacco: Never Used  Vaping Use  . Vaping Use: Never used  Substance Use Topics  . Alcohol use: Yes    Alcohol/week: 5.0 standard drinks    Types: 5 Standard drinks or equivalent per week    Comment: occasionally   . Drug use: Yes    Types: Marijuana    Comment: 3.5 grams per week (3 times per week)    ROS   Objective:   Vitals: BP 133/71   Pulse 73   Temp 98.7 F (37.1 C)   Resp 17   SpO2 99%   Physical Exam Constitutional:      General: She is not in acute distress.    Appearance: Normal appearance. She is well-developed. She is obese. She is not ill-appearing, toxic-appearing or diaphoretic.  HENT:     Head: Normocephalic and atraumatic.     Nose: Nose normal.     Mouth/Throat:     Mouth: Mucous membranes are moist.  Eyes:     Extraocular Movements: Extraocular movements intact.     Pupils: Pupils  are equal, round, and reactive to light.  Cardiovascular:     Rate and Rhythm: Normal rate and regular rhythm.     Pulses: Normal pulses.     Heart sounds: Normal heart sounds. No murmur heard. No friction rub. No gallop.   Pulmonary:     Effort: Pulmonary effort is normal. No respiratory distress.     Breath sounds: Normal breath sounds. No stridor. No wheezing, rhonchi or rales.  Abdominal:     Tenderness: There is no right CVA tenderness or left CVA tenderness.  Skin:    General: Skin is warm and dry.     Findings: No rash.  Neurological:     Mental Status: She is alert and oriented to person, place, and time.  Psychiatric:        Mood and Affect: Mood normal.        Behavior: Behavior normal.        Thought Content: Thought content normal.         Judgment: Judgment normal.      Assessment and Plan :   PDMP not reviewed this encounter.  1. Cough   2. Shortness of breath   3. Bronchospasm with bronchitis, acute   4. Viral syndrome     Will manage for viral illness such as viral URI, viral syndrome, viral rhinitis, COVID-19. Counseled patient on nature of COVID-19 including modes of transmission, diagnostic testing, management and supportive care.  Offered scripts for symptomatic relief. COVID 19 testing is pending. Counseled patient on potential for adverse effects with medications prescribed/recommended today, ER and return-to-clinic precautions discussed, patient verbalized understanding.     Wallis Bamberg, PA-C 02/19/20 1600

## 2020-02-19 NOTE — Discharge Instructions (Signed)

## 2020-05-23 ENCOUNTER — Encounter: Payer: Self-pay | Admitting: Emergency Medicine

## 2020-05-23 ENCOUNTER — Emergency Department
Admission: EM | Admit: 2020-05-23 | Discharge: 2020-05-24 | Disposition: A | Discharge status: Home / Self Care | Payer: Managed Care, Other (non HMO) | Attending: Emergency Medicine | Admitting: Emergency Medicine

## 2020-05-23 ENCOUNTER — Other Ambulatory Visit: Payer: Self-pay

## 2020-05-23 DIAGNOSIS — J45909 Unspecified asthma, uncomplicated: Secondary | ICD-10-CM | POA: Diagnosis not present

## 2020-05-23 DIAGNOSIS — F17210 Nicotine dependence, cigarettes, uncomplicated: Secondary | ICD-10-CM | POA: Diagnosis not present

## 2020-05-23 DIAGNOSIS — E1169 Type 2 diabetes mellitus with other specified complication: Secondary | ICD-10-CM | POA: Diagnosis not present

## 2020-05-23 DIAGNOSIS — Z7984 Long term (current) use of oral hypoglycemic drugs: Secondary | ICD-10-CM | POA: Diagnosis not present

## 2020-05-23 DIAGNOSIS — R1010 Upper abdominal pain, unspecified: Secondary | ICD-10-CM | POA: Insufficient documentation

## 2020-05-23 DIAGNOSIS — R112 Nausea with vomiting, unspecified: Secondary | ICD-10-CM | POA: Insufficient documentation

## 2020-05-23 LAB — URINALYSIS, COMPLETE (UACMP) WITH MICROSCOPIC
Bilirubin Urine: NEGATIVE
Glucose, UA: NEGATIVE mg/dL
Hgb urine dipstick: NEGATIVE
Ketones, ur: NEGATIVE mg/dL
Leukocytes,Ua: NEGATIVE
Nitrite: NEGATIVE
Protein, ur: NEGATIVE mg/dL
Specific Gravity, Urine: 1.012 (ref 1.005–1.030)
pH: 5 (ref 5.0–8.0)

## 2020-05-23 LAB — LIPASE, BLOOD: Lipase: 25 U/L (ref 11–51)

## 2020-05-23 LAB — COMPREHENSIVE METABOLIC PANEL
ALT: 18 U/L (ref 0–44)
AST: 20 U/L (ref 15–41)
Albumin: 3.9 g/dL (ref 3.5–5.0)
Alkaline Phosphatase: 55 U/L (ref 38–126)
Anion gap: 10 (ref 5–15)
BUN: 10 mg/dL (ref 6–20)
CO2: 21 mmol/L — ABNORMAL LOW (ref 22–32)
Calcium: 8.5 mg/dL — ABNORMAL LOW (ref 8.9–10.3)
Chloride: 106 mmol/L (ref 98–111)
Creatinine, Ser: 0.83 mg/dL (ref 0.44–1.00)
GFR, Estimated: >60 mL/min (ref >60)
Glucose, Bld: 125 mg/dL — ABNORMAL HIGH (ref 70–99)
Potassium: 3.7 mmol/L (ref 3.5–5.1)
Sodium: 137 mmol/L (ref 135–145)
Total Bilirubin: 0.6 mg/dL (ref 0.3–1.2)
Total Protein: 6.8 g/dL (ref 6.5–8.1)

## 2020-05-23 LAB — CBC
HCT: 39.1 % (ref 36.0–46.0)
Hemoglobin: 13.5 g/dL (ref 12.0–15.0)
MCH: 28.5 pg (ref 26.0–34.0)
MCHC: 34.5 g/dL (ref 30.0–36.0)
MCV: 82.5 fL (ref 80.0–100.0)
Platelets: 315 10*3/uL (ref 150–400)
RBC: 4.74 MIL/uL (ref 3.87–5.11)
RDW: 12.5 % (ref 11.5–15.5)
WBC: 10.3 10*3/uL (ref 4.0–10.5)
nRBC: 0 % (ref 0.0–0.2)

## 2020-05-23 LAB — POC URINE PREG, ED: Preg Test, Ur: NEGATIVE

## 2020-05-23 MED ORDER — ONDANSETRON HCL 4 MG/2ML IJ SOLN
4.0000 mg | Freq: Once | INTRAMUSCULAR | Status: AC
  Administered 2020-05-23: 4 mg via INTRAVENOUS
  Filled 2020-05-23: qty 2

## 2020-05-23 MED ORDER — ONDANSETRON 4 MG PO TBDP: 4.0000 mg | ORAL_TABLET | Freq: Three times a day (TID) | ORAL | 0 refills | Status: DC | PRN

## 2020-05-23 MED ORDER — ALUM & MAG HYDROXIDE-SIMETH 200-200-20 MG/5ML PO SUSP
30.0000 mL | Freq: Once | ORAL | Status: AC
  Administered 2020-05-23: 30 mL via ORAL
  Filled 2020-05-23: qty 30

## 2020-05-23 MED ORDER — LIDOCAINE VISCOUS HCL 2 % MT SOLN
15.0000 mL | Freq: Once | OROMUCOSAL | Status: AC
  Administered 2020-05-23: 15 mL via ORAL
  Filled 2020-05-23: qty 15

## 2020-05-23 MED ORDER — FOSFOMYCIN TROMETHAMINE 3 G PO PACK
3.0000 g | PACK | Freq: Once | ORAL | Status: AC
  Administered 2020-05-24: 3 g via ORAL
  Filled 2020-05-23: qty 3

## 2020-05-23 MED ORDER — PANTOPRAZOLE SODIUM 40 MG PO TBEC: 40.0000 mg | DELAYED_RELEASE_TABLET | Freq: Every day | ORAL | 1 refills | Status: DC

## 2020-05-23 NOTE — Discharge Instructions (Signed)
Please take your medication as prescribed each day.  Please use over-the-counter Maalox as needed for discomfort and before going to bed for the next 1 week.  Please call the number provided to arrange an appointment with GI medicine for further evaluation.  Return to the emergency department for any worsening pain, fever, or any other symptom personally concerning to yourself.

## 2020-05-23 NOTE — ED Provider Notes (Signed)
[facility] Emergency Department Provider Note  Time seen: 10:26 PM  I have reviewed the triage vital signs and the nursing notes.   HISTORY  Chief Complaint Abdominal Pain   HPI Carrie Wright is a 20 y.o. female with a past medical history of asthma, bipolar, migraine, presents emergency department for upper abdominal pain.  According to the patient for the past 2 days she has been experiencing pain across the upper abdomen.  States the pain is somewhat relieved when she eats food and then will come back 30 or 40 minutes later.  Patient states very rare alcohol use.  Denies any diarrhea but states she has been nauseated with 1 episode of vomiting today.  No dysuria vaginal bleeding or discharge.  No fever.  No chest pain cough or shortness of breath.   Past Medical History:  Diagnosis Date  . Allergy   . Asthma   . Bipolar 2 disorder (HCC)   . Migraine   . Migraine   . Suicidal behavior with attempted self-injury [facility])     Patient Active Problem List   Diagnosis Date Noted  . Type 2 diabetes mellitus without complications (HCC) 02/15/2017  . Anaphylaxis 01/15/2017  . Allergic reaction 01/15/2017  . Prediabetes 06/07/2016  . PCOS (polycystic ovarian syndrome) 07/10/2015  . Amenorrhea 07/10/2015  . Bipolar I disorder, most recent episode depressed (HCC) 05/13/2015  . Tylenol overdose 05/11/2015  . Suicide attempt by acetaminophen overdose (HCC) 05/11/2015  . Generalized anxiety disorder 06/29/2014  . Depression 04/04/2013  . Morbid obesity (HCC) 04/04/2013  . Migraine with aura 05/26/2012  . Episodic tension type headache 05/26/2012  . Acquired acanthosis nigricans 05/26/2012  . 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 Migraine without aura 02/25/2012  . Acanthosis nigricans 02/25/2012  . Migraine, unspecified, without mention of intractable migraine with status migrainosus 02/24/2012    Past Surgical History:  Procedure  Laterality Date  . 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 TONSILLECTOMY     2007  . WISDOM TOOTH EXTRACTION      Prior to Admission medications   Medication Sig Start Date End Date Taking? Authorizing Provider  albuterol (VENTOLIN HFA) 108 (90 Base) MCG/ACT inhaler Inhale 2 puffs into the lungs every 6 (six) hours as needed for wheezing or shortness of breath (coughing). 02/19/20   Wallis Bamberg, PA-C  benzonatate (TESSALON) 100 MG capsule Take 1-2 capsules (100-200 mg total) by mouth 3 (three) times daily as needed. 02/19/20   Wallis Bamberg, PA-C  cariprazine (VRAYLAR) capsule Take 1 capsule (1.5 mg total) by mouth daily. 09/03/18   Campbell Riches, NP  metFORMIN (GLUCOPHAGE) 500 MG tablet Take one tablet daily Patient not taking: No sig reported 06/09/18   Merlyn Albert, MD  Norethindrone-Ethinyl Estradiol-Fe Biphas (LO LOESTRIN FE) 1 MG-10 MCG / 10 MCG tablet Take 1 tablet by mouth daily. Start first Sunday after next cycle begins Patient not taking: Reported on 11/25/2018 09/03/18   Campbell Riches, NP  promethazine-dextromethorphan (PROMETHAZINE-DM) 6.25-15 MG/5ML syrup Take 5 mLs by mouth at bedtime as needed for cough. 02/19/20   Wallis Bamberg, PA-C    Allergies  Allergen Reactions  . Gardasil [Hpv 4-Valent Vaccine Recombinant Vaccine] Rash    Family History  Problem Relation Age of Onset  . Migraines Father   . Migraines Maternal Grandmother   . Cancer Mother   . Migraines Mother   . Depression Mother   . Schizophrenia Maternal Grandfather  Social History Social History   Tobacco Use  . Smoking status: Current Every Day Smoker    Packs/day: 0.25    Years: 3.00    Pack years: 0.75    Types: Cigarettes  . Smokeless tobacco: Never Used  Vaping Use  . Vaping Use: Never used  Substance Use Topics  . Alcohol use: Yes    Alcohol/week: 5.0 standard drinks    Types: 5 Standard drinks or equivalent per week    Comment: occasionally   . Drug use: Yes    Types:  Marijuana    Comment: 3.5 grams per week (3 times per week)    Review of Systems Constitutional: Negative for fever. Cardiovascular: Negative for chest pain. Respiratory: Negative for shortness of breath. Gastrointestinal: Mild to moderate upper abdominal pain described as dull somewhat improved with eating. Genitourinary: Negative for urinary compaints Musculoskeletal: Negative for musculoskeletal complaints Neurological: Negative for headache All other ROS negative  ____________________________________________   PHYSICAL EXAM:  VITAL SIGNS: ED Triage Vitals  Enc Vitals Group     BP 05/23/20 2213 136/87     Pulse Rate 05/23/20 2213 80     Resp 05/23/20 2213 20     Temp 05/23/20 2213 98.4 F (36.9 C)     Temp Source 05/23/20 2213 Oral     SpO2 05/23/20 2213 100 %     Weight 05/23/20 2210 210 lb (95.3 kg)     Height 05/23/20 2210 5\' 5"  (1.651 m)     Head Circumference --      Peak Flow --      Pain Score 05/23/20 2209 8     Pain Loc --      Pain Edu? --      Excl. in [place]? --     Constitutional: Alert and oriented. Well appearing and in no distress. Eyes: Normal exam ENT      Head: Normocephalic and atraumatic.      Mouth/Throat: Mucous membranes are moist. Cardiovascular: Normal rate, regular rhythm.  Respiratory: Normal respiratory effort without tachypnea nor retractions. Breath sounds are clear  Gastrointestinal: Soft, mild abdominal tenderness across the entire upper abdomen without rebound guarding or distention. Musculoskeletal: Nontender with normal range of motion in all extremities.  Neurologic:  Normal speech and language. No gross focal neurologic deficits Skin:  Skin is warm, dry and intact.  Psychiatric: Mood and affect are normal.   ____________________________________________   INITIAL IMPRESSION / ASSESSMENT AND PLAN / ED COURSE  Pertinent labs & imaging results that were available during my care of the patient were reviewed by me and considered  in my medical decision making (see chart for details).   Patient presents emergency department for upper abdominal pain over the past 2 days.  Patient states nausea with one episode of vomiting.  Describes her pain as mild to moderate dull pain somewhat improved after eating.  Symptoms are very suggestive of possible peptic ulcer disease.  We will check labs to evaluate for biliary disease, pancreatitis.  We will continue to closely monitor.  We will dose a GI cocktail monitor for symptom improvement.  Carrie Wright was evaluated in Emergency Department on 05/23/2020 for the symptoms described in the history of present illness. She was evaluated in the context of the global COVID-19 pandemic, which necessitated consideration that the patient might be at risk for infection with the SARS-CoV-2 virus that causes COVID-19. Institutional protocols and algorithms that pertain to the evaluation of patients at risk for COVID-19 are in  a state of rapid change based on information released by regulatory bodies including the CDC and federal and state organizations. These policies and algorithms were followed during the patient's care in the ED.  ____________________________________________   FINAL CLINICAL IMPRESSION(S) / ED DIAGNOSES  Upper abdominal pain Nausea   Minna Antis, MD 05/23/20 2228

## 2020-05-23 NOTE — ED Triage Notes (Signed)
Patient ambulatory to triage with steady gait, without difficulty or distress noted; pt reports x 2 days having upper abd pain that radiates into back and at times to lower abd accomp by N/V; denies hx of same

## 2020-05-24 NOTE — ED Notes (Signed)
Per MD, patient noted to have one episode of vomiting. Plan for zofran, oral abx, then reassess. Patient and family aware of plan of care and verbalize understanding.

## 2020-05-24 NOTE — ED Notes (Signed)
Patient tolerating fluids without nausea/vomiting.

## 2021-01-29 DIAGNOSIS — R519 Headache, unspecified: Secondary | ICD-10-CM | POA: Diagnosis not present

## 2021-01-29 DIAGNOSIS — J069 Acute upper respiratory infection, unspecified: Secondary | ICD-10-CM | POA: Diagnosis not present

## 2021-01-29 DIAGNOSIS — Z20822 Contact with and (suspected) exposure to covid-19: Secondary | ICD-10-CM | POA: Diagnosis not present

## 2021-01-31 ENCOUNTER — Ambulatory Visit: Payer: 59 | Admitting: Family Medicine

## 2021-02-02 ENCOUNTER — Encounter (HOSPITAL_BASED_OUTPATIENT_CLINIC_OR_DEPARTMENT_OTHER): Payer: Self-pay

## 2021-02-02 ENCOUNTER — Ambulatory Visit (HOSPITAL_COMMUNITY)
Admission: EM | Admit: 2021-02-02 | Discharge: 2021-02-02 | Disposition: A | Discharge status: Home / Self Care | Payer: Managed Care, Other (non HMO)

## 2021-02-02 ENCOUNTER — Other Ambulatory Visit: Payer: Self-pay

## 2021-02-02 ENCOUNTER — Encounter (HOSPITAL_COMMUNITY): Payer: Self-pay | Admitting: *Deleted

## 2021-02-02 ENCOUNTER — Emergency Department (HOSPITAL_BASED_OUTPATIENT_CLINIC_OR_DEPARTMENT_OTHER)
Admission: EM | Admit: 2021-02-02 | Discharge: 2021-02-03 | Disposition: A | Discharge status: Home / Self Care | Payer: Managed Care, Other (non HMO) | Attending: Emergency Medicine | Admitting: Emergency Medicine

## 2021-02-02 DIAGNOSIS — J45909 Unspecified asthma, uncomplicated: Secondary | ICD-10-CM | POA: Diagnosis not present

## 2021-02-02 DIAGNOSIS — R0602 Shortness of breath: Secondary | ICD-10-CM

## 2021-02-02 DIAGNOSIS — U071 COVID-19: Secondary | ICD-10-CM | POA: Insufficient documentation

## 2021-02-02 DIAGNOSIS — R519 Headache, unspecified: Secondary | ICD-10-CM | POA: Diagnosis not present

## 2021-02-02 LAB — RESP PANEL BY RT-PCR (FLU A&B, COVID) ARPGX2
Influenza A by PCR: NEGATIVE
Influenza B by PCR: NEGATIVE
SARS Coronavirus 2 by RT PCR: POSITIVE — AB

## 2021-02-02 NOTE — ED Provider Notes (Signed)
[facility]    CSN: [id number] Arrival date & time: 02/02/21  1712      History   Chief Complaint Chief Complaint  Patient presents with   Headache   Fever   Shortness of Breath    HPI Carrie Wright is a 21 y.o. female.   HPI  SOB: Patient states that she has had shortness of breath for the past week.  She states that this shortness of breath is very different from any shortness of breath that she has had before.  She states that this does not feel like a cold or like an asthma flare.  She states that she is not able to do her normal activities due to the shortness of breath.  She does report recent COVID-19 exposure and did have some cold symptoms early on in the week but she states that this shortness of breath is very different.  She has had some chest pains off and on.  No known calf pain.  She does have a family history of clotting diseases.  No recent procedures or long flights.  She is on birth control.  Past Medical History:  Diagnosis Date   Allergy    Asthma    Bipolar 2 disorder ([place])    Migraine    Migraine    Suicidal behavior with attempted self-injury [facility])     Patient Active Problem List   Diagnosis Date Noted   Type 2 diabetes mellitus without complications ([place]) 0000000   Anaphylaxis 01/15/2017   Allergic reaction 01/15/2017   Prediabetes 06/07/2016   PCOS (polycystic ovarian syndrome) 07/10/2015   Amenorrhea 07/10/2015   Bipolar I disorder, most recent episode depressed ([place]) 05/13/2015   Tylenol overdose 05/11/2015   Suicide attempt by acetaminophen overdose ([place]) 05/11/2015   Generalized anxiety disorder 06/29/2014   Depression 04/04/2013   Morbid obesity ([place]) 04/04/2013   Migraine with aura 05/26/2012   Episodic tension type headache 05/26/2012   Acquired acanthosis nigricans 05/26/2012   Reactive airways dysfunction syndrome ([place]) 05/19/2012   Migraine without aura 02/25/2012   Acanthosis nigricans 02/25/2012   Migraine,  unspecified, without mention of intractable migraine with status migrainosus 02/24/2012    Past Surgical History:  Procedure Laterality Date   FOOT SURGERY     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      Home Medications    Prior to Admission medications   Medication Sig Start Date End Date Taking? Authorizing Provider  albuterol (VENTOLIN HFA) 108 (90 Base) MCG/ACT inhaler Inhale 2 puffs into the lungs every 6 (six) hours as needed for wheezing or shortness of breath (coughing). 02/19/20   Jaynee Eagles, PA-C  benzonatate (TESSALON) 100 MG capsule Take 1-2 capsules (100-200 mg total) by mouth 3 (three) times daily as needed. 02/19/20   Jaynee Eagles, PA-C  cariprazine (VRAYLAR) capsule Take 1 capsule (1.5 mg total) by mouth daily. 09/03/18   Nilda Simmer, NP  metFORMIN (GLUCOPHAGE) 500 MG tablet Take one tablet daily Patient not taking: No sig reported 06/09/18   Mikey Kirschner, MD  Norethindrone-Ethinyl Estradiol-Fe Biphas (LO LOESTRIN FE) 1 MG-10 MCG / 10 MCG tablet Take 1 tablet by mouth daily. Start first Sunday after next cycle begins Patient not taking: Reported on 11/25/2018 09/03/18   Nilda Simmer, NP  ondansetron (ZOFRAN ODT) 4 MG disintegrating tablet Take 1  tablet (4 mg total) by mouth every 8 (eight) hours as needed for nausea or vomiting. 05/23/20   Harvest Dark, MD  pantoprazole (PROTONIX) 40 MG tablet Take 1 tablet (40 mg total) by mouth daily. 05/23/20 07/22/20  Harvest Dark, MD  promethazine-dextromethorphan (PROMETHAZINE-DM) 6.25-15 MG/5ML syrup Take 5 mLs by mouth at bedtime as needed for cough. 02/19/20   Jaynee Eagles, PA-C    Family History Family History  Problem Relation Age of Onset   Migraines Father    Migraines Maternal Grandmother    Cancer Mother    Migraines Mother    Depression Mother    Schizophrenia Maternal Grandfather     Social  History Social History   Tobacco Use   Smoking status: Every Day    Packs/day: 0.25    Years: 3.00    Pack years: 0.75    Types: Cigarettes   Smokeless tobacco: Never  Vaping Use   Vaping Use: Never used  Substance Use Topics   Alcohol use: Yes    Alcohol/week: 5.0 standard drinks    Types: 5 Standard drinks or equivalent per week    Comment: occasionally    Drug use: Yes    Types: Marijuana    Comment: 3.5 grams per week (3 times per week)     Allergies   Gardasil [hpv 4-valent vaccine recombinant vaccine]   Review of Systems Review of Systems  As stated above in HPI Physical Exam Triage Vital Signs ED Triage Vitals  Enc Vitals Group     BP 02/02/21 1806 (!) 145/83     Pulse Rate 02/02/21 1806 (!) 56     Resp 02/02/21 1806 18     Temp 02/02/21 1806 98.5 F (36.9 C)     Temp src --      SpO2 02/02/21 1806 98 %     Weight --      Height --      Head Circumference --      Peak Flow --      Pain Score 02/02/21 1802 10     Pain Loc --      Pain Edu? --      Excl. in [place]? --    No data found.  Updated Vital Signs BP (!) 145/83    Pulse (!) 56    Temp 98.5 F (36.9 C)    Resp 18    SpO2 98%    Physical Exam Vitals and nursing note reviewed.  Constitutional:      General: She is not in acute distress.    Appearance: She is well-developed. She is not ill-appearing, toxic-appearing or diaphoretic.  HENT:     Head: Normocephalic and atraumatic.     Mouth/Throat:     Mouth: Mucous membranes are moist.     Pharynx: Oropharynx is clear.  Eyes:     Extraocular Movements: Extraocular movements intact.     Pupils: Pupils are equal, round, and reactive to light.  Cardiovascular:     Rate and Rhythm: Normal rate and regular rhythm.  Pulmonary:     Effort: Pulmonary effort is normal.     Breath sounds: Normal breath sounds. No decreased breath sounds, wheezing, rhonchi or rales.  Musculoskeletal:     Cervical back: Normal range of motion and neck supple.   Lymphadenopathy:     Cervical: No cervical adenopathy.  Skin:    General: Skin is warm.     Capillary Refill: Capillary refill takes less than 2 seconds.  Coloration: Skin is not cyanotic or pale.     Findings: No erythema.  Neurological:     Mental Status: She is alert and oriented to person, place, and time.     UC Treatments / Results  Labs (all labs ordered are listed, but only abnormal results are displayed) Labs Reviewed - No data to display  EKG   Radiology No results found.  Procedures Procedures (including critical care time)  Medications Ordered in UC Medications - No data to display  Initial Impression / Assessment and Plan / UC Course  I have reviewed the triage vital signs and the nursing notes.  Pertinent labs & imaging results that were available during my care of the patient were reviewed by me and considered in my medical decision making (see chart for details).     New.  Given what she is describing very well could be viral in nature however given her increased risk for PE I have recommended that she go to an ER for D-dimer testing.  She is agreeable. Her friend will transport her.  Final Clinical Impressions(s) / UC Diagnoses   Final diagnoses:  None   Discharge Instructions   None    ED Prescriptions   None    PDMP not reviewed this encounter.   Hughie Closs, [place] 02/02/21 1821

## 2021-02-02 NOTE — ED Notes (Signed)
Patient is being discharged from the Urgent Care and sent to the Emergency Department via POV . Per Clent Jacks, PA, patient is in need of higher level of care due to resources to rule out PE. Patient is aware and verbalizes understanding of plan of care.  Vitals:   02/02/21 1806  BP: (!) 145/83  Pulse: (!) 56  Resp: 18  Temp: 98.5 F (36.9 C)  SpO2: 98%

## 2021-02-02 NOTE — ED Triage Notes (Signed)
Patient here POV from Home with Flu-Like Symptoms.  Patient states she has had close Contact with Significant Other who has been sick. Patient has had Congestion, Headache, and Fever for approximately 3-4 days.  Temp: 102.5 on Thursday  NAD Noted during Triage. A&Ox4. GCS 15. Ambulatory.

## 2021-02-02 NOTE — ED Triage Notes (Signed)
Pt reports that for one week has had SHOB,HA and nausea.

## 2021-02-03 NOTE — Discharge Instructions (Signed)
Please follow-up with your primary care doctor.  Come back to ER if you develop difficulty breathing or other new concerning symptom.  Follow CDC precautions regarding isolation.

## 2021-02-03 NOTE — ED Provider Notes (Signed)
[facility] EMERGENCY DEPT Provider Note   CSN: [id number] Arrival date & time: 02/02/21  1855     History  Chief Complaint  Patient presents with   Headache    Carrie Wright is a 21 y.o. female.  Presents to ER with concern for flulike symptoms.  Patient states that she started having chills, may be fever on Wednesday.  Fever on Thursday was up to 102.5.  Her significant other has COVID.  She states that she is still been having mild to moderate frontal headaches.  Also having some nasal congestion.  Currently her only symptom is a mild headache.  She denies any chronic medical problems.  Reports she had asthma in childhood but does not take any inhalers on a regular basis.  HPI     Home Medications Prior to Admission medications   Medication Sig Start Date End Date Taking? Authorizing Provider  albuterol (VENTOLIN HFA) 108 (90 Base) MCG/ACT inhaler Inhale 2 puffs into the lungs every 6 (six) hours as needed for wheezing or shortness of breath (coughing). 02/19/20   Wallis Bamberg, PA-C  benzonatate (TESSALON) 100 MG capsule Take 1-2 capsules (100-200 mg total) by mouth 3 (three) times daily as needed. 02/19/20   Wallis Bamberg, PA-C  cariprazine (VRAYLAR) capsule Take 1 capsule (1.5 mg total) by mouth daily. 09/03/18   Campbell Riches, NP  metFORMIN (GLUCOPHAGE) 500 MG tablet Take one tablet daily Patient not taking: No sig reported 06/09/18   Merlyn Albert, MD  Norethindrone-Ethinyl Estradiol-Fe Biphas (LO LOESTRIN FE) 1 MG-10 MCG / 10 MCG tablet Take 1 tablet by mouth daily. Start first Sunday after next cycle begins Patient not taking: Reported on 11/25/2018 09/03/18   Campbell Riches, NP  ondansetron (ZOFRAN ODT) 4 MG disintegrating tablet Take 1 tablet (4 mg total) by mouth every 8 (eight) hours as needed for nausea or vomiting. 05/23/20   Minna Antis, MD  pantoprazole (PROTONIX) 40 MG tablet Take 1 tablet (40 mg total) by mouth daily. 05/23/20 07/22/20   Minna Antis, MD  promethazine-dextromethorphan (PROMETHAZINE-DM) 6.25-15 MG/5ML syrup Take 5 mLs by mouth at bedtime as needed for cough. 02/19/20   Wallis Bamberg, PA-C      Allergies    Gardasil [hpv 4-valent vaccine recombinant vaccine]    Review of Systems   Review of Systems  Constitutional:  Positive for fever.  Neurological:  Positive for headaches.  Wright other systems reviewed and are negative.  Physical Exam Updated Vital Signs BP 136/66 (BP Location: Right Arm)    Pulse (!) 55    Temp 98.1 F (36.7 C) (Oral)    Resp 16    Ht 5\' 5"  (1.651 m)    Wt 99.8 kg    SpO2 100%    BMI 36.61 kg/m  Physical Exam Vitals and nursing note reviewed.  Constitutional:      General: She is not in acute distress.    Appearance: She is well-developed.  HENT:     Head: Normocephalic and atraumatic.  Eyes:     Conjunctiva/sclera: Conjunctivae normal.  Cardiovascular:     Rate and Rhythm: Normal rate and regular rhythm.     Heart sounds: No murmur heard. Pulmonary:     Effort: Pulmonary effort is normal. No respiratory distress.     Breath sounds: Normal breath sounds.  Abdominal:     Palpations: Abdomen is soft.     Tenderness: There is no abdominal tenderness.  Musculoskeletal:  General: No swelling.     Cervical back: Neck supple.  Skin:    General: Skin is warm and dry.     Capillary Refill: Capillary refill takes less than 2 seconds.  Neurological:     Mental Status: She is alert.  Psychiatric:        Mood and Affect: Mood normal.    ED Results / Procedures / Treatments   Labs (Wright labs ordered are listed, but only abnormal results are displayed) Labs Reviewed  RESP PANEL BY RT-PCR (FLU A&B, COVID) ARPGX2 - Abnormal; Notable for the following components:      Result Value   SARS Coronavirus 2 by RT PCR POSITIVE (*)    Wright other components within normal limits    EKG None  Radiology No results found.  Procedures Procedures    Medications Ordered in  ED Medications - No data to display  ED Course/ Medical Decision Making/ A&P                           Medical Decision Making  21 year old girl presents to ER with concern for flulike symptoms.  On exam she appears remarkably well in no distress.  Lungs are clear to auscultation, no tachypnea or hypoxia, doubt pneumonia.  Do not feel she needs chest imaging today based on exam and history.  COVID test is positive, suspect this is culprit for her symptoms today.  As patient has had symptoms for a few days now and has no significant medical comorbidities, do not feel she would need course of paxlovid. Discussed risks/benefits of this medication with patient and she declined.  Recommended follow-up with primary care doctor.  Discussed isolation precautions.  Carrie Wright was evaluated in Emergency Department on 02/03/2021 for the symptoms described in the history of present illness. She was evaluated in the context of the global COVID-19 pandemic, which necessitated consideration that the patient might be at risk for infection with the SARS-CoV-2 virus that causes COVID-19. Institutional protocols and algorithms that pertain to the evaluation of patients at risk for COVID-19 are in a state of rapid change based on information released by regulatory bodies including the CDC and federal and state organizations. These policies and algorithms were followed during the patient's care in the ED.   After the discussed management above, the patient was determined to be safe for discharge.  The patient was in agreement with this plan and Wright questions regarding their care were answered.  ED return precautions were discussed and the patient will return to the ED with any significant worsening of condition.         Final Clinical Impression(s) / ED Diagnoses Final diagnoses:  COVID-19    Rx / DC Orders ED Discharge Orders     None         Milagros Loll, MD 02/03/21 [phone]

## 2021-04-30 ENCOUNTER — Ambulatory Visit (INDEPENDENT_AMBULATORY_CARE_PROVIDER_SITE_OTHER): Payer: BC Managed Care – PPO | Admitting: Family Medicine

## 2021-04-30 ENCOUNTER — Ambulatory Visit (HOSPITAL_COMMUNITY)
Admission: RE | Admit: 2021-04-30 | Discharge: 2021-04-30 | Disposition: A | Discharge status: Home / Self Care | Payer: BC Managed Care – PPO | Source: Ambulatory Visit | Attending: Family Medicine | Admitting: Family Medicine

## 2021-04-30 ENCOUNTER — Ambulatory Visit: Payer: Self-pay | Admitting: Family Medicine

## 2021-04-30 ENCOUNTER — Encounter: Payer: Self-pay | Admitting: Family Medicine

## 2021-04-30 VITALS — BP 144/90 | HR 65 | Temp 98.0°F | Ht 65.0 in | Wt 218.8 lb

## 2021-04-30 DIAGNOSIS — R519 Headache, unspecified: Secondary | ICD-10-CM | POA: Diagnosis not present

## 2021-04-30 DIAGNOSIS — Z130 Encounter for screening for diseases of the blood and blood-forming organs and certain disorders involving the immune mechanism: Secondary | ICD-10-CM

## 2021-04-30 DIAGNOSIS — E119 Type 2 diabetes mellitus without complications: Secondary | ICD-10-CM

## 2021-04-30 DIAGNOSIS — J4540 Moderate persistent asthma, uncomplicated: Secondary | ICD-10-CM | POA: Diagnosis not present

## 2021-04-30 DIAGNOSIS — H4710 Unspecified papilledema: Secondary | ICD-10-CM | POA: Diagnosis not present

## 2021-04-30 DIAGNOSIS — H9319 Tinnitus, unspecified ear: Secondary | ICD-10-CM | POA: Insufficient documentation

## 2021-04-30 DIAGNOSIS — L209 Atopic dermatitis, unspecified: Secondary | ICD-10-CM | POA: Insufficient documentation

## 2021-04-30 DIAGNOSIS — Z87892 Personal history of anaphylaxis: Secondary | ICD-10-CM | POA: Insufficient documentation

## 2021-04-30 DIAGNOSIS — J45909 Unspecified asthma, uncomplicated: Secondary | ICD-10-CM | POA: Insufficient documentation

## 2021-04-30 DIAGNOSIS — L309 Dermatitis, unspecified: Secondary | ICD-10-CM

## 2021-04-30 MED ORDER — GADOBUTROL 1 MMOL/ML IV SOLN
9.0000 mL | Freq: Once | INTRAVENOUS | Status: AC | PRN
  Administered 2021-04-30: 9 mL via INTRAVENOUS

## 2021-04-30 MED ORDER — ALBUTEROL SULFATE HFA 108 (90 BASE) MCG/ACT IN AERS: 2.0000 | INHALATION_SPRAY | Freq: Four times a day (QID) | RESPIRATORY_TRACT | 3 refills | Status: AC | PRN

## 2021-04-30 MED ORDER — TRIAMCINOLONE ACETONIDE 0.5 % EX OINT: 1.0000 "application " | TOPICAL_OINTMENT | Freq: Two times a day (BID) | CUTANEOUS | 0 refills | Status: DC

## 2021-04-30 MED ORDER — BUDESONIDE-FORMOTEROL FUMARATE 160-4.5 MCG/ACT IN AERO: 2.0000 | INHALATION_SPRAY | Freq: Two times a day (BID) | RESPIRATORY_TRACT | 3 refills | Status: AC

## 2021-04-30 NOTE — Assessment & Plan Note (Signed)
Will likely need to see ENT.  Deferring for now given urgent matter of papilledema. ?

## 2021-04-30 NOTE — Assessment & Plan Note (Signed)
Uncontrolled.  Starting on Symbicort. ?

## 2021-04-30 NOTE — Progress Notes (Signed)
? ?Subjective:  ?Patient ID: Carrie Wright, female    DOB: [birth year]  Age: 21 y.o. MRN: [id number] ? ?CC: ?Chief Complaint  ?Patient presents with  ? Eye Problem  ?  Elevated  right optic nerve elevated  per eye dr yesterday ?Having throat issues- did telehealth visit Monday night - feels like both sides of throat are swelling. Taking advil q 12 hours ?Eczema and asthma is worsening.  ?Extreme tinnitus in right ear that is going to left ear-going on for about one year  ? ? ?HPI: ? ?21 year old female with a history of bipolar disorder, type 2 diabetes, PCOS, asthma, and migraine presents for evaluation of the above. ? ?Patient recently saw an eye doctor and was found to have papilledema in both eyes.  I spoke with the eye doctor today.  She confirms bilateral papilledema.  Patient states that she has had ongoing issues since June 2022.  She has had pain particular in the right eye she is also had periods of time where her right eye has been red.  She also endorses intermittent flashes of light. ? ?Patient also reports that she has had some numbness and tingling in her right arm.  She is also had ongoing tinnitus of the right ear.  Now having left ear tinnitus as well.  Has been going on for more than a year. ? ?Additionally, patient reports that she has had worsening asthma and eczema since October.  She has been using albuterol several times a day. ? ?Patient is not currently on any medication other than albuterol. ? ?Patient Active Problem List  ? Diagnosis Date Noted  ? History of anaphylaxis 04/30/2021  ? Asthma 04/30/2021  ? Eczema 04/30/2021  ? Tinnitus 04/30/2021  ? Papilledema 04/30/2021  ? Type 2 diabetes mellitus without complications ([place]) 41/63/8453  ? PCOS (polycystic ovarian syndrome) 07/10/2015  ? Bipolar I disorder, most recent episode depressed ([place]) 05/13/2015  ? Suicide attempt by acetaminophen overdose ([place]) 05/11/2015  ? Migraine without aura 02/25/2012  ? ? ?Social Hx   ?Social History   ? ?Socioeconomic History  ? Marital status: Single  ?  Spouse name: Not on file  ? Number of children: Not on file  ? Years of education: Not on file  ? Highest education level: Not on file  ?Occupational History  ? Not on file  ?Tobacco Use  ? Smoking status: Former  ?  Packs/day: 0.25  ?  Years: 3.00  ?  Pack years: 0.75  ?  Types: Cigarettes  ? Smokeless tobacco: Never  ?Vaping Use  ? Vaping Use: Never used  ?Substance and Sexual Activity  ? Alcohol use: Yes  ?  Alcohol/week: 5.0 standard drinks  ?  Types: 5 Standard drinks or equivalent per week  ?  Comment: occasionally   ? Drug use: Yes  ?  Types: Marijuana  ?  Comment: 3.5 grams per week (3 times per week)  ? Sexual activity: Yes  ?  Birth control/protection: None  ?  Comment: has had same sex partner  ?Other Topics Concern  ? Not on file  ?Social History Narrative  ? Lives at home with mother, father, no siblings.  Has 2 cats and 1 dog inside the house.  ? ?Social Determinants of Health  ? ?Financial Resource Strain: Not on file  ?Food Insecurity: Not on file  ?Transportation Needs: Not on file  ?Physical Activity: Not on file  ?Stress: Not on file  ?Social Connections: Not on file  ? ? ?  Review of Systems ?Per HPI ? ?Objective:  ?BP (!) 144/90   Pulse 65   Temp 98 ?F (36.7 ?C)   Ht _0  (1.651 m)   Wt 218 lb 12.8 oz (99.2 kg)   SpO2 98%   BMI 36.41 kg/m?  ? ? ?  04/30/2021  ? 11:14 AM 02/03/2021  ? 12:58 AM 02/02/2021  ?  7:46 PM  ?BP/Weight  ?Systolic BP 983 382   ?Diastolic BP 90 66   ?Wt. (Lbs) 218.8  220  ?BMI 36.41 kg/m2  36.61 kg/m2  ? ? ?Physical Exam ?Constitutional:   ?   Appearance: Normal appearance. She is obese.  ?HENT:  ?   Head: Normocephalic and atraumatic.  ?   Right Ear: Tympanic membrane normal.  ?   Left Ear: Tympanic membrane normal.  ?Eyes:  ?   Conjunctiva/sclera: Conjunctivae normal.  ?Cardiovascular:  ?   Rate and Rhythm: Normal rate and regular rhythm.  ?Pulmonary:  ?   Effort: Pulmonary effort is normal.  ?   Breath sounds:  Normal breath sounds. No wheezing, rhonchi or rales.  ?Neurological:  ?   Mental Status: She is alert.  ?Psychiatric:     ?   Mood and Affect: Mood normal.     ?   Behavior: Behavior normal.  ? ? ?Lab Results  ?Component Value Date  ? WBC 10.3 05/23/2020  ? HGB 13.5 05/23/2020  ? HCT 39.1 05/23/2020  ? PLT 315 05/23/2020  ? GLUCOSE 125 (H) 05/23/2020  ? CHOL 151 06/05/2016  ? TRIG 125 (H) 06/05/2016  ? HDL 40 06/05/2016  ? [place] 86 06/05/2016  ? ALT 18 05/23/2020  ? AST 20 05/23/2020  ? NA 137 05/23/2020  ? K 3.7 05/23/2020  ? CL 106 05/23/2020  ? CREATININE 0.83 05/23/2020  ? BUN 10 05/23/2020  ? CO2 21 (L) 05/23/2020  ? TSH 1.790 06/05/2016  ? INR 1.24 05/13/2015  ? HGBA1C 6.4 05/13/2017  ? ? ? ?Assessment & Plan:  ? ?Problem List Items Addressed This Visit   ? ?  ? Respiratory  ? Asthma  ?  Uncontrolled.  Starting on Symbicort. ? ?  ?  ? Relevant Medications  ? budesonide-formoterol (SYMBICORT) 160-4.5 MCG/ACT inhaler  ? albuterol (VENTOLIN HFA) 108 (90 Base) MCG/ACT inhaler  ?  ? Endocrine  ? Type 2 diabetes mellitus without complications ([place])  ?  Unsure of current status.  Labs today. ? ?  ?  ? Relevant Orders  ? CMP14+EGFR  ? Hemoglobin A1c  ? Microalbumin / creatinine urine ratio  ? Lipid panel  ?  ? Nervous and Auditory  ? Papilledema - Primary  ?  Recently noted papilledema.  Spoke with eye doctor. ?This is very concerning.  Needs stat imaging -MRI brain for further evaluation.  ?Urgent referral to neurology. ? ? ?  ?  ? Relevant Orders  ? Ambulatory referral to Neurology  ? MR Brain W Wo Contrast  ?  ? Musculoskeletal and Integument  ? Eczema  ?  Treating with triamcinolone. ? ?  ?  ?  ? Other  ? Tinnitus  ?  Will likely need to see ENT.  Deferring for now given urgent matter of papilledema. ? ?  ?  ? ?Other Visit Diagnoses   ? ? Screening for deficiency anemia      ? Relevant Orders  ? CBC  ? ?  ? ? ?Meds ordered this encounter  ?Medications  ? triamcinolone ointment (KENALOG) 0.5 %  ?  Sig: Apply 1  application. topically 2 (two) times daily.  ?  Dispense:  30 g  ?  Refill:  0  ? budesonide-formoterol (SYMBICORT) 160-4.5 MCG/ACT inhaler  ?  Sig: Inhale 2 puffs into the lungs 2 (two) times daily.  ?  Dispense:  1 each  ?  Refill:  3  ? albuterol (VENTOLIN HFA) 108 (90 Base) MCG/ACT inhaler  ?  Sig: Inhale 2 puffs into the lungs every 6 (six) hours as needed for wheezing or shortness of breath (coughing).  ?  Dispense:  18 g  ?  Refill:  3  ? ?Thersa Salt DO ?[place] ? ?

## 2021-04-30 NOTE — Patient Instructions (Addendum)
Labs when you leave. ? ?We are arranging the MRI. ? ?Medications as prescribed. ? ?Follow up will be arranged after MRI. ? ?

## 2021-04-30 NOTE — Assessment & Plan Note (Addendum)
Recently noted papilledema.  Spoke with eye doctor. ?This is very concerning.  Needs stat imaging -MRI brain for further evaluation.  ?Urgent referral to neurology. ? ?

## 2021-04-30 NOTE — Assessment & Plan Note (Signed)
Treating with triamcinolone. 

## 2021-04-30 NOTE — Assessment & Plan Note (Signed)
Unsure of current status.  Labs today. 

## 2021-05-01 ENCOUNTER — Other Ambulatory Visit: Payer: Managed Care, Other (non HMO)

## 2021-05-01 ENCOUNTER — Encounter: Payer: Self-pay | Admitting: Family Medicine

## 2021-05-01 LAB — CMP14+EGFR
ALT: 23 IU/L (ref 0–32)
AST: 21 IU/L (ref 0–40)
Albumin/Globulin Ratio: 1.5 (ref 1.2–2.2)
Albumin: 4.3 g/dL (ref 3.9–5.0)
Alkaline Phosphatase: 91 IU/L (ref 44–121)
BUN/Creatinine Ratio: 10 (ref 9–23)
BUN: 8 mg/dL (ref 6–20)
Bilirubin Total: <0.2 mg/dL (ref 0.0–1.2)
CO2: 23 mmol/L (ref 20–29)
Calcium: 9.2 mg/dL (ref 8.7–10.2)
Chloride: 102 mmol/L (ref 96–106)
Creatinine, Ser: 0.78 mg/dL (ref 0.57–1.00)
Globulin, Total: 2.9 g/dL (ref 1.5–4.5)
Glucose: 83 mg/dL (ref 70–99)
Potassium: 4.7 mmol/L (ref 3.5–5.2)
Sodium: 140 mmol/L (ref 134–144)
Total Protein: 7.2 g/dL (ref 6.0–8.5)
eGFR: 110 mL/min/{1.73_m2} (ref >59)

## 2021-05-01 LAB — LIPID PANEL
Chol/HDL Ratio: 3.6 ratio (ref 0.0–4.4)
Cholesterol, Total: 135 mg/dL (ref 100–199)
HDL: 37 mg/dL — ABNORMAL LOW (ref >39)
LDL Chol Calc (NIH): 83 mg/dL (ref 0–99)
Triglycerides: 72 mg/dL (ref 0–149)
VLDL Cholesterol Cal: 15 mg/dL (ref 5–40)

## 2021-05-01 LAB — MICROALBUMIN / CREATININE URINE RATIO
Creatinine, Urine: 99.4 mg/dL
Microalb/Creat Ratio: 3 mg/g creat (ref 0–29)
Microalbumin, Urine: 3.1 ug/mL

## 2021-05-01 LAB — CBC
Hematocrit: 40.1 % (ref 34.0–46.6)
Hemoglobin: 12.8 g/dL (ref 11.1–15.9)
MCH: 25.1 pg — ABNORMAL LOW (ref 26.6–33.0)
MCHC: 31.9 g/dL (ref 31.5–35.7)
MCV: 79 fL (ref 79–97)
Platelets: 402 10*3/uL (ref 150–450)
RBC: 5.1 x10E6/uL (ref 3.77–5.28)
RDW: 15.2 % (ref 11.7–15.4)
WBC: 14.5 10*3/uL — ABNORMAL HIGH (ref 3.4–10.8)

## 2021-05-01 LAB — HEMOGLOBIN A1C
Est. average glucose Bld gHb Est-mCnc: 126 mg/dL
Hgb A1c MFr Bld: 6 % — ABNORMAL HIGH (ref 4.8–5.6)

## 2021-05-07 ENCOUNTER — Ambulatory Visit: Payer: BC Managed Care – PPO | Admitting: Neurology

## 2021-05-07 NOTE — Progress Notes (Signed)
? ?NEUROLOGY CONSULTATION NOTE ? ?Carrie Wright ?MRN: [id number] ?DOB: 02/17/2000 ? ?Referring provider: Everlene Other, DO ?Primary care provider: Everlene Other, DO ? ?Reason for consult:  papilledema ? ?Assessment/Plan:  ? ?1  Probable idiopathic intracranial hypertension ?2  Right sided cervical radiculopathy ?3  Migraine with aura ? ?Lumbar puncture ?MRV of head ?Pending results, will start acetazolamide 500mg  twice daily and would have her get a repeat eye exam in 6-8 weeks ?Recommended physical therapy for treatment of neck and radicular pain - declines at this time ?Weight loss ?Follow up 4-5 months. ? ? ?Subjective:  ?Carrie Wright is a 21 year old right-handed female with asthma, PCOS, DM II, Bipolar 2 disorder and migraines who presents for papilledema.  History supplemented by referring provider's note.  She is accompanied by her mother.  ?  ?For about a year, she describes various symptoms.  He reports what sounds like blood rushing in her head.  She also reports fuzzy vision in her periphery and sometimes will have static that blocks her entire vision.  She has longstanding history of migraines since childhood in band-like distribution with visual aura (flashes of color), nausea, photophobia.  They are infrequent.  Over the past year, these headaches are right sided or left sided without associated symptoms, worse when standing or moving around.  She also notes pain and numbness down the right arm to the index finger.  Neck pain.  Over the past couple of days notices in in the right hip.  Sometimes has low back spasm.  She saw the eye doctor this month and was found to have bilateral papilledema.  MRI of brain with and without contrast on 04/30/2021 personally reviewed was normal.  Over the past year, she reports 20 lb weight gain.  No birth control.   ? ?Current NSAIDS/analgesics:  none ?Current triptans:  none ?Current ergotamine:  none ?Current anti-emetic:  none ?Current muscle relaxants:  none ?Current  Antihypertensive medications:  none ?Current Antidepressant medications:  none ?Current Anticonvulsant medications:  none ?Current anti-CGRP:  none ? ? ?Past NSAIDS/analgesics:  naproxen ?Past abortive triptans:  eletriptan, sumatriptan tab, rizatriptan ?Past abortive ergotamine:  none ?Past muscle relaxants:  tizanidine ?Past anti-emetic:  Zofran, Phenergan ?Past antihypertensive medications:  none ?Past antidepressant/antipsychotic medications:  Wellbutrin, Vraylar ?Past anticonvulsant medications:  Depakote, topiramate ?Past anti-CGRP:  none ?Past antihistamines/decongestants:  Benadryl ? ? ? ?PAST MEDICAL HISTORY: ?Past Medical History:  ?Diagnosis Date  ? Allergy   ? Asthma   ? Bipolar 2 disorder ([facility])   ? Migraine   ? Migraine   ? Suicidal behavior with attempted self-injury ([facility])   ? ? ?PAST SURGICAL HISTORY: ?Past Surgical History:  ?Procedure Laterality Date  ? FOOT SURGERY    ? 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 TONSILLECTOMY    ? 2007  ? WISDOM TOOTH EXTRACTION    ? ? ?MEDICATIONS: ?Current Outpatient Medications on File Prior to Visit  ?Medication Sig Dispense Refill  ? albuterol (VENTOLIN HFA) 108 (90 Base) MCG/ACT inhaler Inhale 2 puffs into the lungs every 6 (six) hours as needed for wheezing or shortness of breath (coughing). 18 g 3  ? budesonide-formoterol (SYMBICORT) 160-4.5 MCG/ACT inhaler Inhale 2 puffs into the lungs 2 (two) times daily. 1 each 3  ? triamcinolone ointment (KENALOG) 0.5 % Apply 1 application. topically 2 (two) times daily. 30 g 0  ? ?No current facility-administered medications on file prior to visit.  ? ? ?ALLERGIES: ?Allergies  ?Allergen Reactions  ? Gardasil 2008  4-Valent Vaccine Recombinant Vaccine] Rash  ? ? ?FAMILY HISTORY: ?Family History  ?Problem Relation Age of Onset  ? Migraines Father   ? Migraines Maternal Grandmother   ? Cancer Mother   ? Migraines Mother   ? Depression Mother   ? Schizophrenia Maternal Grandfather   ? ? ?Objective:  ?Blood pressure (!) 149/78, pulse  74, height 5\' 5"  (1.651 m), weight 220 lb (99.8 kg), SpO2 100 %. ?General: No acute distress.  Patient appears well-groomed.   ?Head:  Normocephalic/atraumatic ?Eyes:  fundi examined but not visualized ?Neck: supple, bilateral paraspinal tenderness, full range of motion ?Back: bilateral paraspinal tenderness ?Heart: regular rate and rhythm ?Lungs: Clear to auscultation bilaterally. ?Vascular: No carotid bruits. ?Neurological Exam: ?Mental status: alert and oriented to person, place, and time, recent and remote memory intact, fund of knowledge intact, attention and concentration intact, speech fluent and not dysarthric, language intact. ?Cranial nerves: ?CN I: not tested ?CN II: pupils equal, round and reactive to light, visual fields intact ?CN III, IV, VI:  full range of motion, no nystagmus, no ptosis ?CN V: facial sensation intact. ?CN VII: upper and lower face symmetric ?CN VIII: hearing intact ?CN IX, X: gag intact, uvula midline ?CN XI: sternocleidomastoid and trapezius muscles intact ?CN XII: tongue midline ?Bulk & Tone: normal, no fasciculations. ?Motor:  muscle strength 5/5 throughout ?Sensation:  Pinprick, temperature and vibratory sensation intact. ?Deep Tendon Reflexes:  2+ throughout,  toes downgoing.   ?Finger to nose testing:  Without dysmetria.   ?Heel to shin:  Without dysmetria.   ?Gait:  Normal station and stride.  Romberg negative. ? ? ? ?Thank you for allowing me to take part in the care of this patient. ? ? , DO ? ?CC: Shon Millet, DO ? ? ? ? ?

## 2021-05-08 ENCOUNTER — Ambulatory Visit: Payer: BC Managed Care – PPO | Admitting: Neurology

## 2021-05-08 ENCOUNTER — Encounter: Payer: Self-pay | Admitting: Neurology

## 2021-05-08 VITALS — BP 149/78 | HR 74 | Ht 65.0 in | Wt 220.0 lb

## 2021-05-08 DIAGNOSIS — G932 Benign intracranial hypertension: Secondary | ICD-10-CM

## 2021-05-08 DIAGNOSIS — G43109 Migraine with aura, not intractable, without status migrainosus: Secondary | ICD-10-CM | POA: Diagnosis not present

## 2021-05-08 DIAGNOSIS — G08 Intracranial and intraspinal phlebitis and thrombophlebitis: Secondary | ICD-10-CM | POA: Diagnosis not present

## 2021-05-08 DIAGNOSIS — M5412 Radiculopathy, cervical region: Secondary | ICD-10-CM | POA: Diagnosis not present

## 2021-05-08 NOTE — Patient Instructions (Signed)
Schedule for spinal tap to check opening pressure.  Will also check cell count, protein, glucose, gram stain and culture ?Check MRV of head ?After spinal tap, plan is to start acetazolamide to help reduce pressure and will have you get another eye exam in 6 to 8 weeks. ?Follow up 4 to 5 months ?

## 2021-05-16 ENCOUNTER — Ambulatory Visit
Admission: RE | Admit: 2021-05-16 | Discharge: 2021-05-16 | Disposition: A | Discharge status: Home / Self Care | Payer: BC Managed Care – PPO | Source: Ambulatory Visit | Attending: Neurology | Admitting: Neurology

## 2021-05-16 DIAGNOSIS — H4710 Unspecified papilledema: Secondary | ICD-10-CM | POA: Diagnosis not present

## 2021-05-17 ENCOUNTER — Encounter: Payer: Self-pay | Admitting: Neurology

## 2021-05-20 ENCOUNTER — Telehealth: Payer: Self-pay

## 2021-05-20 DIAGNOSIS — G932 Benign intracranial hypertension: Secondary | ICD-10-CM

## 2021-05-20 DIAGNOSIS — G08 Intracranial and intraspinal phlebitis and thrombophlebitis: Secondary | ICD-10-CM

## 2021-05-20 NOTE — Telephone Encounter (Signed)
-----   Message from Pieter Partridge, DO sent at 05/17/2021  6:15 AM EDT ----- ?The MRV shows possible clot but it may just be artifact.  For better visualization, I would like to get CTV of head with contrast ASAP ?

## 2021-05-20 NOTE — Telephone Encounter (Signed)
Results given to paitent, advised patient I will call the insurance to get it approved. ? ? ?BCBS: ?Spoke to [facility] ?Ref# [id number] Dates 05/20/21-06/18/21. ? ?Spoke to [employer], ?No Appt right now, ?Please advise patient that she can walk in today before 3 pm at the [address] location. If not patient can call and see what location she will be able to walk into.  ?

## 2021-05-22 ENCOUNTER — Ambulatory Visit
Admission: RE | Admit: 2021-05-22 | Discharge: 2021-05-22 | Disposition: A | Discharge status: Home / Self Care | Payer: BC Managed Care – PPO | Source: Ambulatory Visit | Attending: Neurology | Admitting: Neurology

## 2021-05-22 ENCOUNTER — Telehealth: Payer: Self-pay

## 2021-05-22 VITALS — BP 160/81 | HR 73

## 2021-05-22 DIAGNOSIS — M5412 Radiculopathy, cervical region: Secondary | ICD-10-CM

## 2021-05-22 DIAGNOSIS — G08 Intracranial and intraspinal phlebitis and thrombophlebitis: Secondary | ICD-10-CM

## 2021-05-22 DIAGNOSIS — G43109 Migraine with aura, not intractable, without status migrainosus: Secondary | ICD-10-CM

## 2021-05-22 DIAGNOSIS — G932 Benign intracranial hypertension: Secondary | ICD-10-CM | POA: Diagnosis not present

## 2021-05-22 DIAGNOSIS — G43909 Migraine, unspecified, not intractable, without status migrainosus: Secondary | ICD-10-CM | POA: Diagnosis not present

## 2021-05-22 MED ORDER — ACETAZOLAMIDE ER 500 MG PO CP12: 500.0000 mg | ORAL_CAPSULE | Freq: Two times a day (BID) | ORAL | 5 refills | Status: DC

## 2021-05-22 NOTE — Telephone Encounter (Signed)
-----   Message from Pieter Partridge, DO sent at 05/22/2021 12:18 PM EDT ----- ?Spinal tap confirms elevated pressure.  I would like to send a prescription for patient to start acetazolamide 500mg  twice daily.  I wold like for her to get a repeat eye exam in about 8 weeks and have notes faxed to my office.  She should be reminded that the medication may cause numbness and tingling sensation, so she shouldn't be alarmed if she experiences this. ?

## 2021-05-22 NOTE — Discharge Instructions (Signed)

## 2021-05-22 NOTE — Telephone Encounter (Signed)
Patient advise of her Spinal results. ?Acetazolamide sent to the pharmacy.  ?

## 2021-05-22 NOTE — Progress Notes (Signed)
LMOVM for patient to call the office back.

## 2021-05-26 LAB — CSF CULTURE W GRAM STAIN
MICRO NUMBER:: 13377035
Result:: NO GROWTH
SPECIMEN QUALITY:: ADEQUATE

## 2021-05-26 LAB — CSF CELL COUNT WITH DIFFERENTIAL
RBC Count, CSF: 0 cells/uL
TOTAL NUCLEATED CELL: 2 cells/uL (ref 0–5)

## 2021-05-26 LAB — GLUCOSE, CSF: Glucose, CSF: 65 mg/dL (ref 40–80)

## 2021-05-30 ENCOUNTER — Ambulatory Visit
Admission: RE | Admit: 2021-05-30 | Discharge: 2021-05-30 | Disposition: A | Discharge status: Home / Self Care | Payer: BC Managed Care – PPO | Source: Ambulatory Visit | Attending: Neurology | Admitting: Neurology

## 2021-05-30 DIAGNOSIS — J3489 Other specified disorders of nose and nasal sinuses: Secondary | ICD-10-CM | POA: Diagnosis not present

## 2021-05-30 DIAGNOSIS — G08 Intracranial and intraspinal phlebitis and thrombophlebitis: Secondary | ICD-10-CM

## 2021-05-30 MED ORDER — IOPAMIDOL (ISOVUE-300) INJECTION 61%
75.0000 mL | Freq: Once | INTRAVENOUS | Status: AC | PRN
  Administered 2021-05-30: 75 mL via INTRAVENOUS

## 2021-05-31 NOTE — Progress Notes (Signed)
Patient advised of her CTV results. And to repeat your eye exam.

## 2021-07-11 DIAGNOSIS — G932 Benign intracranial hypertension: Secondary | ICD-10-CM | POA: Diagnosis not present

## 2021-09-30 ENCOUNTER — Other Ambulatory Visit: Payer: Self-pay

## 2021-10-03 NOTE — Progress Notes (Signed)
NEUROLOGY FOLLOW UP OFFICE NOTE  Carrie Wright [id number]  Assessment/Plan:   1  Idiopathic intracranial hypertension 2 Migraine with aura   As she is still symptomatic, will increase acetazolamide to 750mg  twice daily.  Advised to have repeat eye exam in 2 months and have notes sent to me. Obtain records from July from her eye doctor Follow up 4 months..     Subjective:  Carrie Wright is a 21 year old right-handed female with asthma, PCOS, DM II, Bipolar 2 disorder and migraines who follows up for idiopathic intracranial hypertension and migraines.  UPDATE: MRV of head on 05/16/2021 personally reviewed showed hypoplastic but patent left transverse sinus and filling defect in the right jugular bulb representing artifact or thrombus.  Unable to obtain IV access to confirm.  She had a follow up CTV of head on 05/30/2021 which did not reveal thrombosis but did demonstrate new left maxillary sinus mucosal thickening.  She didn't follow up with her PCP but she is feeling well.  She underwent LP on 05/22/2021 which demonstrate elevated opening pressure of 42 cm of water.  She was started on acetazolamide 500mg  twice daily.  She was advised to have a repeat eye exam in 8 weeks.  She had a follow up eye exam in July.  Notes were not sent to me.  She reports that the left eye optic disc edema looked a lot better but still some papilledema in the right eye.    Still reports pulsatile tinnitus in the right.  She also reports new non-pulsatile tinnitus in right ear with right ear pain.  Still with the headaches, although improved.  Headaches are now once a week, described as a "lingering" 4/10 intensity and lasts off and on all day.  Does not treat it.  No visual obscurations.     Current medications:  acetazolamide 500mg  BID   HISTORY:  Symptoms started in 2020.  She reports what sounds like blood rushing in her head.  She also reports fuzzy vision in her periphery and sometimes will have static  that blocks her entire vision.  She has longstanding history of migraines since childhood in band-like distribution with visual aura (flashes of color), nausea, photophobia.  They are infrequent. However, she developed new headaches which are right sided or left sided without associated symptoms, worse when standing or moving around.  She also notes pain and numbness down the right arm to the index finger.  Neck pain.  She saw the eye doctor this month and was found to have bilateral papilledema.  MRI of brain with and without contrast on 04/30/2021 was normal.  Over that past year, she reports 20 lb weight gain.  No birth control.        Past NSAIDS/analgesics:  naproxen Past abortive triptans:  eletriptan, sumatriptan tab, rizatriptan Past abortive ergotamine:  none Past muscle relaxants:  tizanidine Past anti-emetic:  Zofran, Phenergan Past antihypertensive medications:  none Past antidepressant/antipsychotic medications:  Wellbutrin, Vraylar Past anticonvulsant medications:  Depakote, topiramate Past anti-CGRP:  none Past antihistamines/decongestants:  Benadryl  PAST MEDICAL HISTORY: Past Medical History:  Diagnosis Date   Allergy    Asthma    Bipolar 2 disorder ([place])    Migraine    Migraine    Suicidal behavior with attempted self-injury [facility])     MEDICATIONS: Current Outpatient Medications on File Prior to Visit  Medication Sig Dispense Refill   acetaZOLAMIDE ER (DIAMOX) 500 MG capsule Take 1 capsule (500 mg total) by mouth  2 (two) times daily. 60 capsule 5   albuterol (VENTOLIN HFA) 108 (90 Base) MCG/ACT inhaler Inhale 2 puffs into the lungs every 6 (six) hours as needed for wheezing or shortness of breath (coughing). 18 g 3   budesonide-formoterol (SYMBICORT) 160-4.5 MCG/ACT inhaler Inhale 2 puffs into the lungs 2 (two) times daily. 1 each 3   triamcinolone ointment (KENALOG) 0.5 % Apply 1 application. topically 2 (two) times daily. 30 g 0   No current facility-administered  medications on file prior to visit.    ALLERGIES: Allergies  Allergen Reactions   Gardasil [Hpv 4-Valent Vaccine Recombinant Vaccine] Rash    FAMILY HISTORY: Family History  Problem Relation Age of Onset   Migraines Father    Migraines Maternal Grandmother    Cancer Mother    Migraines Mother    Depression Mother    Schizophrenia Maternal Grandfather       Objective:  Blood pressure (!) 141/86, pulse 89, height 5\' 5"  (1.651 m), weight 229 lb 3.2 oz (104 kg). General: No acute distress.  Patient appears well-groomed.    , DO  CC: Carrie Millet, DO

## 2021-10-07 ENCOUNTER — Ambulatory Visit: Payer: BC Managed Care – PPO | Admitting: Neurology

## 2021-10-07 ENCOUNTER — Encounter: Payer: Self-pay | Admitting: Neurology

## 2021-10-07 VITALS — BP 141/86 | HR 89 | Ht 65.0 in | Wt 229.2 lb

## 2021-10-07 DIAGNOSIS — G932 Benign intracranial hypertension: Secondary | ICD-10-CM

## 2021-10-07 DIAGNOSIS — G43109 Migraine with aura, not intractable, without status migrainosus: Secondary | ICD-10-CM | POA: Diagnosis not present

## 2021-10-07 MED ORDER — ACETAZOLAMIDE 250 MG PO TABS: 750.0000 mg | ORAL_TABLET | Freq: Two times a day (BID) | ORAL | 5 refills | Status: DC

## 2021-10-18 ENCOUNTER — Ambulatory Visit (LOCAL_COMMUNITY_HEALTH_CENTER): Payer: Self-pay

## 2021-10-18 DIAGNOSIS — Z111 Encounter for screening for respiratory tuberculosis: Secondary | ICD-10-CM

## 2021-10-21 ENCOUNTER — Ambulatory Visit (LOCAL_COMMUNITY_HEALTH_CENTER): Payer: Self-pay

## 2021-10-21 DIAGNOSIS — Z111 Encounter for screening for respiratory tuberculosis: Secondary | ICD-10-CM

## 2021-10-21 LAB — TB SKIN TEST
Induration: 0 mm
TB Skin Test: NEGATIVE

## 2021-10-23 ENCOUNTER — Telehealth: Payer: Self-pay | Admitting: Anesthesiology

## 2021-10-23 DIAGNOSIS — H9210 Otorrhea, unspecified ear: Secondary | ICD-10-CM

## 2021-10-23 NOTE — Telephone Encounter (Signed)
Patient requesting a call back has questions about her last office visit with Dr Tomi Likens.

## 2021-10-23 NOTE — Telephone Encounter (Signed)
LMOVm for patient to give the office a call back.  

## 2021-10-23 NOTE — Telephone Encounter (Signed)
Per patient she had some liquid like substance in her ear. Increase spinal fluid she thinks is leaking out of ears.  Appt with Opthmalogist on 11/06/21

## 2021-10-23 NOTE — Telephone Encounter (Signed)
LMOVm for patient to call back.  She needs to be seen by otolaryngology (ENT)

## 2021-10-23 NOTE — Telephone Encounter (Signed)
Pt called back in returning Sheena's call 

## 2021-11-01 ENCOUNTER — Ambulatory Visit (LOCAL_COMMUNITY_HEALTH_CENTER): Payer: Self-pay

## 2021-11-01 DIAGNOSIS — Z111 Encounter for screening for respiratory tuberculosis: Secondary | ICD-10-CM

## 2021-11-04 ENCOUNTER — Ambulatory Visit (LOCAL_COMMUNITY_HEALTH_CENTER): Payer: Self-pay

## 2021-11-04 DIAGNOSIS — Z111 Encounter for screening for respiratory tuberculosis: Secondary | ICD-10-CM

## 2021-11-04 LAB — TB SKIN TEST
Induration: 0 mm
TB Skin Test: NEGATIVE

## 2021-11-06 ENCOUNTER — Telehealth: Payer: Self-pay | Admitting: Neurology

## 2021-11-06 NOTE — Telephone Encounter (Signed)
Pt called in stating she was told to let Dr. Tomi Likens know when she had pictures of her eyes taken. She says they were taken today at [facility] on [address].

## 2021-11-07 NOTE — Telephone Encounter (Signed)
Called and spoke with Pt and she is going to call eye doctor and get that sent over to [place]. Reported to me that the picture were all they did. No consults.

## 2021-11-07 NOTE — Telephone Encounter (Signed)
Fax received [facility] referral scheduled for 11/20/21 at 9:10 am with Dr.Bennett.

## 2021-11-20 DIAGNOSIS — H93A1 Pulsatile tinnitus, right ear: Secondary | ICD-10-CM | POA: Diagnosis not present

## 2021-11-20 DIAGNOSIS — H6983 Other specified disorders of Eustachian tube, bilateral: Secondary | ICD-10-CM | POA: Diagnosis not present

## 2021-11-20 DIAGNOSIS — J301 Allergic rhinitis due to pollen: Secondary | ICD-10-CM | POA: Diagnosis not present

## 2022-01-31 NOTE — Progress Notes (Signed)
22 y.o. No obstetric history on file. Significant Other other or two or more races female here for NEW GYN.  Having facial excessive hair growth with ingrown hairs. Has chest and stomach hair.  Told she may have PCOS since age 89.  No prior evaluation.   Having changes prior to her menstrual cycles for the last 2 - 3 years.  Feels irrational, heightened emotions, really sensitive with suicidal ideation.   Symptoms resolve with her cycle onset.   Has a psychiatry appointment tomorrow for first visit and sees a therapist.   Menarche 70.  Menses are once a month, since Nov. 2023.  Prior to this can skip 2 months at a time.  In 2023, had about 6 menstrual cycles.  Last 5 days.    Does not need pregnancy prevention.  Female partner preference.  Has intracranial HTN.  Had imaging which was negative for intracranial thrombosis.   Wants to go to nursing school.   PCP:   Thersa Salt, DO  Patient's last menstrual period was 01/14/2022.     Period Pattern: (!) Irregular Menstrual Flow: Moderate Menstrual Control: Tampon, Maxi pad Dysmenorrhea: (!) Moderate     Sexually active: Yes.    The current method of family planning is none.    Exercising: No.     Smoker:  no  Health Maintenance: Pap:  n/a History of abnormal Pap:  no MMG:  n/a Colonoscopy:  n/a BMD:   n/a  Result  n/a TDaP:  2011 Gardasil:   yes, one.  Possible reaction to vaccine with injection site 1 cm increased skin pigmentation.    reports that she has quit smoking. Her smoking use included cigarettes. She has a 0.75 pack-year smoking history. She has never used smokeless tobacco. She reports current alcohol use of about 5.0 standard drinks of alcohol per week. She reports current drug use. Drug: Marijuana.  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 180 tablet 5   albuterol (VENTOLIN HFA) 108 (90 Base) MCG/ACT inhaler Inhale 2 puffs into the lungs every 6 (six) hours as needed for wheezing or shortness of breath (coughing). 18 g 3   budesonide-formoterol (SYMBICORT) 160-4.5 MCG/ACT inhaler Inhale 2 puffs into the lungs 2 (two) times daily. 1 each 3   triamcinolone ointment (KENALOG) 0.5 % Apply 1 application. topically 2 (two) times daily. 30 g 0   No current facility-administered medications for this visit.    Family History  Problem Relation Age of Onset   Cancer Mother    Migraines Mother    Depression Mother    Melanoma Mother    Migraines Father    Migraines Maternal Grandmother    Schizophrenia Maternal Grandfather    Breast cancer Paternal Grandmother    Colon cancer Maternal Great-grandmother     Review of Systems  All other systems reviewed and are negative.   Exam:   BP 122/86 (BP Location: Right Arm, Patient  Position: Sitting, Cuff Size: Normal)   Pulse 77   Ht 5' 5.5" (1.664 m)   Wt 228 lb (103.4 kg)   LMP 01/14/2022   SpO2 98%   BMI 37.36 kg/m     General appearance: alert, cooperative and appears stated age Head: normocephalic, without obvious abnormality, atraumatic Neck: no adenopathy, supple, symmetrical, trachea midline and thyroid normal to inspection and palpation Lungs: clear to auscultation bilaterally Breasts: normal appearance, no masses or tenderness, No nipple retraction or dimpling, No nipple discharge or bleeding, No axillary adenopathy Heart: regular rate and rhythm Abdomen: soft, non-tender; no masses, no organomegaly Extremities: extremities normal, atraumatic, no cyanosis or edema Skin: skin  color, texture, turgor normal.  Significant chin facial hair with skin rash.  Lymph nodes: cervical, supraclavicular, and axillary nodes normal. Neurologic: grossly normal  Pelvic: External genitalia:  no lesions              No abnormal inguinal nodes palpated.              Urethra:  normal appearing urethra with no masses, tenderness or lesions              Bartholins and Skenes: normal                 Vagina: normal appearing vagina with normal color and discharge, no lesions              Cervix: no lesions              Pap taken: yes Bimanual Exam:  Uterus:  normal size, contour, position, consistency, mobility, non-tender              Adnexa: no mass, fullness, tenderness             Chaperone was present for exam:  yes  Assessment:   Well woman visit with gynecologic exam. PMDD with suicidal ideation. Seeing psychiatry tomorrow. Hirsutism.   Migraine HA with possible aura. Idiopathic intracranial hypertension.   Papilledema.  DM.  Plan: Mammogram screening age 1. Self breast awareness reviewed. Pap and HR HPV collected. Guidelines for Calcium, Vitamin D, regular exercise program including cardiovascular and weight bearing exercise. STD screening.   Will check TSH, prolactin, FSH, LH, testosterone, DHEAS, 17 OH-P, cortisol. We discussed treatment options for hirsutism.  She is not a candidate for COCs but could use spironolactone, Vaniqa, laser or electrolysis treatment.   She will see psychiatry tomorrow.  We discussed treatment with SSRIs and potentially other medication for her PMDD.  Return for pelvic ultrasound and follow up.  Follow up annually and prn.   Additional time was spent for this patient encounter, including preparation, face-to-face counseling with the patient, coordination of care, and documentation of the encounter regarding PMDD and hirsutism in addition to performing her well woman with GYN visit.   After visit summary provided.

## 2022-02-13 ENCOUNTER — Other Ambulatory Visit (HOSPITAL_COMMUNITY)
Admission: RE | Admit: 2022-02-13 | Discharge: 2022-02-13 | Disposition: A | Discharge status: Home / Self Care | Payer: BC Managed Care – PPO | Source: Ambulatory Visit | Attending: Obstetrics and Gynecology | Admitting: Obstetrics and Gynecology

## 2022-02-13 ENCOUNTER — Encounter: Payer: Self-pay | Admitting: Obstetrics and Gynecology

## 2022-02-13 ENCOUNTER — Ambulatory Visit: Payer: BC Managed Care – PPO | Admitting: Obstetrics and Gynecology

## 2022-02-13 VITALS — BP 122/86 | HR 77 | Ht 65.5 in | Wt 228.0 lb

## 2022-02-13 DIAGNOSIS — Z114 Encounter for screening for human immunodeficiency virus [HIV]: Secondary | ICD-10-CM | POA: Diagnosis not present

## 2022-02-13 DIAGNOSIS — Z113 Encounter for screening for infections with a predominantly sexual mode of transmission: Secondary | ICD-10-CM | POA: Insufficient documentation

## 2022-02-13 DIAGNOSIS — F3281 Premenstrual dysphoric disorder: Secondary | ICD-10-CM

## 2022-02-13 DIAGNOSIS — Z01419 Encounter for gynecological examination (general) (routine) without abnormal findings: Secondary | ICD-10-CM | POA: Diagnosis not present

## 2022-02-13 DIAGNOSIS — Z1159 Encounter for screening for other viral diseases: Secondary | ICD-10-CM | POA: Diagnosis not present

## 2022-02-13 DIAGNOSIS — L680 Hirsutism: Secondary | ICD-10-CM | POA: Diagnosis not present

## 2022-02-13 DIAGNOSIS — N926 Irregular menstruation, unspecified: Secondary | ICD-10-CM

## 2022-02-13 DIAGNOSIS — Z124 Encounter for screening for malignant neoplasm of cervix: Secondary | ICD-10-CM

## 2022-02-13 NOTE — Patient Instructions (Signed)
Spironolactone Tablets What is this medication? SPIRONOLACTONE ([place]er on [place] LAK tone) treats high blood pressure and heart failure. It may also be used to reduce swelling related to heart, kidney, or liver disease. It helps your kidneys remove more fluid and salt from your blood through the urine without losing too much potassium. It belongs to a group of medications called diuretics. This medicine may be used for other purposes; ask your health care provider or pharmacist if you have questions. COMMON BRAND NAME(S): Aldactone What should I tell my care team before I take this medication? They need to know if you have any of these conditions: Addison's disease or low adrenal gland function High blood level of potassium Kidney disease Liver disease An unusual or allergic reaction to spironolactone, other medications, foods, dyes, or preservatives Pregnant or trying to get pregnant Breast-feeding How should I use this medication? Take this medication by mouth. Take it as directed on the prescription label at the same time every day. You can take it with or without food. You should always take it the same way. Keep taking it unless your care team tells you to stop. Talk to your care team about the use of this medication in children. Special care may be needed. Overdosage: If you think you have taken too much of this medicine contact a poison control center or emergency room at once. NOTE: This medicine is only for you. Do not share this medicine with others. What if I miss a dose? If you miss a dose, take it as soon as you can. If it is almost time for your next dose, take only that dose. Do not take double or extra doses. What may interact with this medication? Do not take this medication with any of the following: Cidofovir Eplerenone Tranylcypromine This medication may also interact with the following: Aspirin Certain medications for blood pressure or heart disease, such as benazepril,  lisinopril, losartan, valsartan Certain medications that prevent or treat blood clots, such as heparin and enoxaparin Cholestyramine Cyclosporine Digoxin Lithium Medications that relax muscles for surgery NSAIDs, medications for pain and inflammation, such as ibuprofen or naproxen Other diuretics Potassium salts or supplements Steroid medications, such as prednisone or cortisone Trimethoprim This list may not describe all possible interactions. Give your health care provider a list of all the medicines, herbs, non-prescription drugs, or dietary supplements you use. Also tell them if you smoke, drink alcohol, or use illegal drugs. Some items may interact with your medicine. What should I watch for while using this medication? Visit your care team for regular checks on your progress. Check your blood pressure as directed. Know what your blood pressure should be and when to contact your care team. Do not treat yourself for coughs, colds, or pain while you are using this medication without asking your care team for advice. Some medications may increase your blood pressure. Check with your care team if you have severe diarrhea, nausea, and vomiting, or if you sweat a lot. The loss of too much body fluid may make it dangerous for you to take this medication. You may need to be on a special diet while you are taking this medication. Ask your care team. Also, find out how many glasses of fluids you need to drink each day. This medication may affect your coordination, reaction time, or judgment. Do not drive or operate machinery until you know how this medication affects you. Sit up or stand slowly to reduce the risk of dizzy or fainting  spells. Drinking alcohol with this medication can increase the risk of these side effects. Avoid salt substitutes unless you are told otherwise by your care team. What side effects may I notice from receiving this medication? Side effects that you should report to your  care team as soon as possible: Allergic reactions--skin rash, itching, hives, swelling of the face, lips, tongue, or throat Dehydration--increased thirst, dry mouth, feeling faint or lightheaded, headache, dark yellow or brown urine High potassium level--muscle weakness, fast or irregular heartbeat Kidney injury--decrease in the amount of urine, swelling of the ankles, hands, or feet Low blood pressure--dizziness, feeling faint or lightheaded, blurry vision Low sodium level--muscle weakness, fatigue, dizziness, headache, confusion Side effects that usually do not require medical attention (report to your care team if they continue or are bothersome): Breast pain or tenderness Changes in sex drive or performance Dizziness Headache Irregular menstrual cycles or spotting Unexpected breast tissue growth This list may not describe all possible side effects. Call your doctor for medical advice about side effects. You may report side effects to FDA at 1-800-FDA-1088. Where should I keep my medication? Keep out of the reach of children and pets. Store below 25 degrees C (77 degrees F). Get rid of any unused medication after the expiration date. To get rid of medications that are no longer needed or have expired: Take the medication to a medication take-back program. Check with your pharmacy or law enforcement to find a location. If you cannot return the medication, check the label or package insert to see if the medication should be thrown out in the garbage or flushed down the toilet. If you are not sure, ask your care team. If it is safe to put into the trash, take the medication out of the container. Mix the medication with cat litter, dirt, coffee grounds, or other unwanted substance. Seal the mixture in a bag or container. Put it in the trash. NOTE: This sheet is a summary. It may not cover all possible information. If you have questions about this medicine, talk to your doctor, pharmacist, or  health care provider.  2023 Elsevier/Gold Standard (2020-04-12 00:00:00)   Hirsutism and Masculinization Hirsutism is when a female has an excessive amount of hair in an area where a female would typically have hair growth, such as on the face, chest, or back. Masculinization is when a female's body develops certain characteristics that are like a female's body. What are the causes? This condition may be caused by: Polycystic ovary syndrome (PCOS). This is the most common cause. Certain medicines, such as androgens and anabolic steroids. Cushing's syndrome. Tumors. Increased levels of androgen (testosterone). What increases the risk? The following factors may make you more likely to develop this condition: Family history. Obesity. What are the signs or symptoms? Hirsutism Symptoms of this condition include excess hair growth in areas where women typically do not grow hair, such as on the: Face. Chest. Thighs. Back. Masculinization Symptoms of this condition include: Deepening voice. Decreased breast size. Increased muscle mass. Thinning hair on the head (balding). Irregular or no menstrual periods. Enlarged clitoris. How is this diagnosed? These conditions may be diagnosed based on: Your medical history. A physical exam. Tests that may include: Blood tests. Imaging studies. Your health care provider may recommend that you follow up with specialists to understand the cause of your condition or to help treat it. How is this treated? This condition may be treated by: Taking medicines to help control hair growth. Making lifestyle changes to  help reduce hormone levels that are contributing to your condition. Removing unwanted hair by shaving, using creams, or waxing. More permanent ways to remove hair include electrolysis and laser treatments. If your symptoms are caused by certain medicines, your health care provider may have you stop taking them. Follow these instructions at  home: Take over-the-counter and prescription medicines only as told by your health care provider. Talk with your health care provider about your treatment options and what may be best for you. Maintain a healthy weight. If needed, talk with your health care provider about how to lose weight. Keep all follow-up visits. This is important. Contact a health care provider if: Your symptoms suddenly get worse. You develop acne. You have irregular menstrual periods. You stop having your menstrual period. You feel a lump in your lower abdomen. Summary Hirsutism is when a female has an excessive amount of hair in an area where a female would typically have hair growth, such as on the face, chest, thighs, or back. Masculinization is when a female's body develops characteristics like a female's body. This may include a deepening voice, decreased breast size, increased muscle mass, thinning hair (balding), changes in menstrual periods, and clitoris growth. The most common cause of this condition is polycystic ovary syndrome (PCOS). Treatment options include removing unwanted hair, taking medicines, and making lifestyle changes. Talk with your health care provider about which treatments are best for you. Your health care provider may refer you to specialists to find the cause of your condition. This information is not intended to replace advice given to you by your health care provider. Make sure you discuss any questions you have with your health care provider. Document Revised: 06/09/2019 Document Reviewed: 06/09/2019 Elsevier Patient Education  [place].

## 2022-02-14 DIAGNOSIS — F411 Generalized anxiety disorder: Secondary | ICD-10-CM | POA: Diagnosis not present

## 2022-02-14 DIAGNOSIS — F3181 Bipolar II disorder: Secondary | ICD-10-CM | POA: Diagnosis not present

## 2022-02-14 LAB — TSH: TSH: 0.76 mIU/L

## 2022-02-14 LAB — CORTISOL: Cortisol, Plasma: 7.8 ug/dL

## 2022-02-14 LAB — HEPATITIS C ANTIBODY: Hepatitis C Ab: NONREACTIVE

## 2022-02-14 LAB — FSH/LH
FSH: 7.5 m[IU]/mL
LH: 12.3 m[IU]/mL

## 2022-02-14 LAB — HIV ANTIBODY (ROUTINE TESTING W REFLEX): HIV 1&2 Ab, 4th Generation: NONREACTIVE

## 2022-02-14 LAB — PROLACTIN: Prolactin: 10.6 ng/mL

## 2022-02-14 LAB — DHEA-SULFATE: DHEA-SO4: 148 ug/dL (ref 14–349)

## 2022-02-14 LAB — RPR: RPR Ser Ql: NONREACTIVE

## 2022-02-17 LAB — 17-HYDROXYPROGESTERONE: 17-OH-Progesterone, LC/MS/MS: 42 ng/dL

## 2022-02-20 LAB — CYTOLOGY - PAP
Adequacy: ABSENT
Chlamydia: NEGATIVE
Comment: NEGATIVE
Comment: NEGATIVE
Comment: NORMAL
Diagnosis: NEGATIVE
Neisseria Gonorrhea: NEGATIVE
Trichomonas: NEGATIVE

## 2022-02-24 NOTE — Progress Notes (Unsigned)
NEUROLOGY FOLLOW UP OFFICE NOTE  TRULY WADHAMS [id number]  Assessment/Plan:   1  Idiopathic intracranial hypertension 2 Migraine with aura, without status migrainosus, not intractable   Continue acetazolamide 752m twice daily Refer to ophthalmology for formal eye exam Follow up 4 to 5 months.     Subjective:  Carrie BROWNBACKis a 22year old right-handed female with asthma, PCOS, DM II, Bipolar 2 disorder and migraines who follows up for idiopathic intracranial hypertension and migraines.   UPDATE: Current medications:  acetazolamide 7539mBID  Increased acetazolamide last visit due to experiencing pulsatile tinnitus.  Still hears it but has significantly decreased.  Sometimes gets blurred vision around the eyes but much less frequent.  She has not had a recent formal eye exam.  In October, she endorsed clear liquid draining from her ear.  She was concerned about CSF leak.  She saw ENT on 11/8 who noted intact eardrum and stated that it was likely mosture from bathing or seat from the ear canal.       HISTORY:  Symptoms started in 2020.  She reports what sounds like blood rushing in her head.  She also reports fuzzy vision in her periphery and sometimes will have static that blocks her entire vision.  She has longstanding history of migraines since childhood in band-like distribution with visual aura (flashes of color), nausea, photophobia.  They are infrequent. However, she developed new headaches which are right sided or left sided without associated symptoms, worse when standing or moving around.  She also notes pain and numbness down the right arm to the index finger.  Neck pain.  She saw the eye doctor this month and was found to have bilateral papilledema.  MRI of brain with and without contrast on 04/30/2021 was normal.  MRV of head on 05/16/2021 personally reviewed showed hypoplastic but patent left transverse sinus and filling defect in the right jugular bulb representing  artifact or thrombus.  Unable to obtain IV access to confirm.  She had a follow up CTV of head on 05/30/2021 which did not reveal thrombosis but did demonstrate new left maxillary sinus mucosal thickening.  She didn't follow up with her PCP but she is feeling well.  She underwent LP on 05/22/2021 which demonstrate elevated opening pressure of 42 cm of water.  Over that past year, she reported 20 lb weight gain.  No birth control.         Past NSAIDS/analgesics:  naproxen Past abortive triptans:  eletriptan, sumatriptan tab, rizatriptan Past abortive ergotamine:  none Past muscle relaxants:  tizanidine Past anti-emetic:  Zofran, Phenergan Past antihypertensive medications:  none Past antidepressant/antipsychotic medications:  Wellbutrin, Vraylar Past anticonvulsant medications:  Depakote, topiramate Past anti-CGRP:  none Past antihistamines/decongestants:  Benadryl  PAST MEDICAL HISTORY: Past Medical History:  Diagnosis Date   Allergy    Asthma    Bipolar 2 disorder ([facility]   Migraine    Migraine    [facility]    MEDICATIONS: Current Outpatient Medications on File Prior to Visit  Medication Sig Dispense Refill   acetaZOLAMIDE (DIAMOX) 250 MG tablet Take 3 tablets (750 mg total) by mouth 2 (two) times daily. 180 tablet 5   albuterol (VENTOLIN HFA) 108 (90 Base) MCG/ACT inhaler Inhale 2 puffs into the lungs every 6 (six) hours as needed for wheezing or shortness of breath (coughing). 18 g 3   budesonide-formoterol (SYMBICORT) 160-4.5 MCG/ACT inhaler Inhale 2 puffs into the lungs 2 (  two) times daily. 1 each 3   triamcinolone ointment (KENALOG) 0.5 % Apply 1 application. topically 2 (two) times daily. 30 g 0   No current facility-administered medications on file prior to visit.    ALLERGIES: Allergies  Allergen Reactions   Gardasil [Hpv 4-Valent Vaccine Recombinant Vaccine] Rash    FAMILY HISTORY: Family History  Problem Relation Age of  Onset   Cancer Mother    Migraines Mother    Depression Mother    Melanoma Mother    Migraines Father    Migraines Maternal Grandmother    Schizophrenia Maternal Grandfather    Breast cancer Paternal Grandmother    Colon cancer Maternal Great-grandmother       Objective:  Blood pressure (!) 142/92, pulse 81, height 5' 5"$  (1.651 m), weight 235 lb (106.6 kg), last menstrual period 01/14/2022, SpO2 97 %. General: No acute distress.  Patient appears well-groomed.   Head:  Normocephalic/atraumatic Eyes:  Fundi examined but not visualized Neck: supple, no paraspinal tenderness, full range of motion Heart:  Regular rate and rhythm Neurological Exam: alert and oriented to person, place, and time.  Speech fluent and not dysarthric, language intact.  CN II-XII intact. Bulk and tone normal, muscle strength 5/5 throughout.  Sensation to light touch intact.  Deep tendon reflexes 2+ throughout.  Finger to nose testing intact.  Gait normal, Romberg negative.   Metta Clines, DO  CC: Thersa Salt, DO

## 2022-02-25 ENCOUNTER — Ambulatory Visit: Payer: BC Managed Care – PPO | Admitting: Neurology

## 2022-02-25 ENCOUNTER — Encounter: Payer: Self-pay | Admitting: Neurology

## 2022-02-25 VITALS — BP 142/92 | HR 81 | Ht 65.0 in | Wt 235.0 lb

## 2022-02-25 DIAGNOSIS — G43009 Migraine without aura, not intractable, without status migrainosus: Secondary | ICD-10-CM | POA: Diagnosis not present

## 2022-02-25 DIAGNOSIS — G932 Benign intracranial hypertension: Secondary | ICD-10-CM | POA: Diagnosis not present

## 2022-02-25 NOTE — Patient Instructions (Signed)
CONTINUE ACETAZOLAMIDE 750MG TWICE DAILY REFER TO OPHTHALMOLOGY.  CONTACT ME IF YOU DO NOT GET A CALL FOR APPOINTMENT IN A WEEK FOLLOW UP 4 TO 5 MONTHS.

## 2022-02-27 NOTE — Progress Notes (Signed)
GYNECOLOGY  VISIT   HPI: 22 y.o.   Significant Other  Other or two or more races  female   [id number] with Patient's last menstrual period was 01/14/2022.   here for   U/S consult for suspected polycystic ovarian disease.   Monthly menses at the end of 2023.  No menses yet in February.  Dealing with hirsutism.  Labs showed LH>> FSH.  No testosterone was checked.   Has severe PMDD and has seen psychiatry.  She is now taking Lamictal and doing better now.   Cycles were more regular when she was moving more.   GYNECOLOGIC HISTORY: Patient's last menstrual period was 01/14/2022. Contraception:  none, female only partners. Menopausal hormone therapy:  n/a Last mammogram:  n/a Last pap smear:   02/13/22 neg: HR HPV neg        OB History     Gravida  0   Para  0   Term  0   Preterm  0   AB  0   Living  0      SAB  0   IAB  0   Ectopic  0   Multiple  0   Live Births  0              Patient Active Problem List   Diagnosis Date Noted   History of anaphylaxis 04/30/2021   Asthma 04/30/2021   Eczema 04/30/2021   Tinnitus 04/30/2021   Papilledema 04/30/2021   Type 2 diabetes mellitus without complications ([place]) 0000000   PCOS (polycystic ovarian syndrome) 07/10/2015   Bipolar I disorder, most recent episode depressed ([place]) 05/13/2015   Suicide attempt by acetaminophen overdose ([place]) 05/11/2015   Migraine without aura 02/25/2012    Past Medical History:  Diagnosis Date   Allergy    Asthma    Bipolar 2 disorder ([place])    Migraine    Migraine    Suicidal behavior with attempted self-injury [facility])     Past Surgical History:  Procedure Laterality Date   FOOT SURGERY     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     acetaZOLAMIDE (DIAMOX) 250 MG tablet Take 3 tablets (750 mg total) by mouth  2 (two) times daily. 180 tablet 5   albuterol (VENTOLIN HFA) 108 (90 Base) MCG/ACT inhaler Inhale 2 puffs into the lungs every 6 (six) hours as needed for wheezing or shortness of breath (coughing). 18 g 3   budesonide-formoterol (SYMBICORT) 160-4.5 MCG/ACT inhaler Inhale 2 puffs into the lungs 2 (two) times daily. 1 each 3   lamoTRIgine (LAMICTAL) 25 MG tablet Take 25 mg by mouth daily.     triamcinolone ointment (KENALOG) 0.5 % Apply 1 application. topically 2 (two) times daily. 30 g 0   No current facility-administered medications for this visit.     ALLERGIES: Gardasil [hpv 4-valent vaccine recombinant vaccine]  Family History  Problem Relation Age of Onset   Cancer Mother    Migraines Mother    Depression Mother    Melanoma Mother    Migraines Father    Migraines Maternal Grandmother    Schizophrenia Maternal Grandfather    Breast cancer Paternal Grandmother    Colon cancer Maternal Great-grandmother     Social History   Socioeconomic History   Marital status: Significant Other  Spouse name: Not on file   Number of children: 0   Years of education: Not on file   Highest education level: Not on file  Occupational History   Not on file  Tobacco Use   Smoking status: Former    Packs/day: 0.25    Years: 3.00    Total pack years: 0.75    Types: Cigarettes   Smokeless tobacco: Never  Vaping Use   Vaping Use: Never used  Substance and Sexual Activity   Alcohol use: Yes    Alcohol/week: 5.0 standard drinks of alcohol    Types: 5 Standard drinks or equivalent per week    Comment: occasionally 2 times a month   Drug use: Yes    Types: Marijuana    Comment: 3.5 grams per week (3 times per week)   Sexual activity: Yes    Birth control/protection: None    Comment: has had same sex partner  Other Topics Concern   Not on file  Social History Narrative   Lives at home with mother, father, no siblings.  Has 2 cats and 1 dog inside the house.      Right Handed     Drinks caffeine    Social Determinants of Health   Financial Resource Strain: Not on file  Food Insecurity: Not on file  Transportation Needs: Not on file  Physical Activity: Not on file  Stress: Not on file  Social Connections: Not on file  Intimate Partner Violence: Not on file    Review of Systems  All other systems reviewed and are negative.   PHYSICAL EXAMINATION:    BP 126/78 (BP Location: Left Arm, Patient Position: Sitting, Cuff Size: Large)   Pulse 74   Ht 5' 5.5" (1.664 m)   Wt 233 lb (105.7 kg)   LMP 01/14/2022   SpO2 98%   BMI 38.18 kg/m     General appearance: alert, cooperative and appears stated age  Pelvic US  Uterus 7.54 x 4.05 x 3.71 cm.  No myometrial masses.  EMS 4.1 mm. No masses.  Left ovary 4.11 x 2.71 x 2.06 cm.  20 - 25 small follicles. Right ovary 4.85 x 2.52 x 1.57 cm.  20 - 25 small follicles. No adnexal masses.  No free fluid.  ASSESSMENT  Polycystic ovaries.  PMDD, severe.  On Lamictal.  Intracranial HTN.  DM. Migraine with possible aura.  PLAN  [place] images and report reviewed.  Check testosterone. She will check with her neurologist about spironolactone.  Declines Vaniqua.  We discussed electrolysis, laser hair removal.  Provera 5 mg x 5 days per month prn no cycle.  I discussed potential side effects.  She knows she needs to do a pregnancy test before taking each month if she has a female partner.  FU yearly and prn.    An After Visit Summary was printed and given to the patient.  25 min  total time was spent for this patient encounter, including preparation, face-to-face counseling with the patient, coordination of care, and documentation of the encounter.

## 2022-03-05 ENCOUNTER — Telehealth: Payer: Self-pay | Admitting: Neurology

## 2022-03-05 DIAGNOSIS — G932 Benign intracranial hypertension: Secondary | ICD-10-CM

## 2022-03-05 NOTE — Telephone Encounter (Signed)
Pt called in stating she was told if she had not heard back from the Ophthalmologist in a week, then to give [place] a call. She has not heard from anyone.

## 2022-03-06 NOTE — Telephone Encounter (Signed)
Referral sent to [facility], [phone].

## 2022-03-13 ENCOUNTER — Ambulatory Visit (INDEPENDENT_AMBULATORY_CARE_PROVIDER_SITE_OTHER): Payer: BC Managed Care – PPO

## 2022-03-13 ENCOUNTER — Encounter: Payer: Self-pay | Admitting: Obstetrics and Gynecology

## 2022-03-13 ENCOUNTER — Ambulatory Visit (INDEPENDENT_AMBULATORY_CARE_PROVIDER_SITE_OTHER): Payer: BC Managed Care – PPO | Admitting: Obstetrics and Gynecology

## 2022-03-13 VITALS — BP 126/78 | HR 74 | Ht 65.5 in | Wt 233.0 lb

## 2022-03-13 DIAGNOSIS — N926 Irregular menstruation, unspecified: Secondary | ICD-10-CM | POA: Diagnosis not present

## 2022-03-13 DIAGNOSIS — E282 Polycystic ovarian syndrome: Secondary | ICD-10-CM

## 2022-03-13 MED ORDER — MEDROXYPROGESTERONE ACETATE 5 MG PO TABS: 5.0000 mg | ORAL_TABLET | Freq: Every day | ORAL | 3 refills | Status: DC

## 2022-03-13 NOTE — Patient Instructions (Signed)
Medroxyprogesterone Tablets What is this medication? MEDROXYPROGESTERONE (me DROX ee proe JES te rone) treats irregular menstrual cycles. It may also be used to prevent the lining of the uterus from becoming too thick in people taking estrogen after menopause. It works by increasing levels of the hormone progestin in the body. This medication is a progestin hormone. This medicine may be used for other purposes; ask your health care provider or pharmacist if you have questions. COMMON BRAND NAME(S): Amen, Provera What should I tell my care team before I take this medication? They need to know if you have any of these conditions: Blood vessel disease or a history of a blood clot in the lungs or legs Breast, cervical, or vaginal cancer Heart disease Kidney disease Liver disease Migraine Recent miscarriage or abortion Mental depression Seizures Stroke Vaginal bleeding that has not been evaluated An unusual or allergic reaction to medroxyprogesterone, other medications, foods, dyes, or preservatives Pregnant or trying to get pregnant Breast-feeding How should I use this medication? Take this medication by mouth with a glass of water. Follow the directions on the prescription label. Take your doses at regular intervals. Do not take your medication more often than directed. Talk to your care team about the use of this medication in children. Special care may be needed. While this medication may be prescribed for children as young as 13 years for selected conditions, precautions do apply. Overdosage: If you think you have taken too much of this medicine contact a poison control center or emergency room at once. NOTE: This medicine is only for you. Do not share this medicine with others. What if I miss a dose? If you miss a dose, take it as soon as you can. If it is almost time for your next dose, take only that dose. Do not take double or extra doses. What may interact with this  medication? Barbiturate medications for inducing sleep or treating seizures Bosentan Carbamazepine Phenytoin Rifampin St. John's Wort This list may not describe all possible interactions. Give your health care provider a list of all the medicines, herbs, non-prescription drugs, or dietary supplements you use. Also tell them if you smoke, drink alcohol, or use illegal drugs. Some items may interact with your medicine. What should I watch for while using this medication? Visit your care team for regular checks on your progress. You will need a regular breast and pelvic exam. If you have any reason to think you are pregnant, stop taking this medication at once and contact your care team. What side effects may I notice from receiving this medication? Side effects that you should report to your care team as soon as possible: Allergic reactions--skin rash, itching, hives, swelling of the face, lips, tongue, or throat Blood clot--pain, swelling, or warmth in the leg, shortness of breath, chest pain Breast tissue changes, new lumps, redness, pain, or discharge from the nipple Gallbladder problems--severe stomach pain, nausea, vomiting, fever Increase in blood pressure Liver injury--right upper belly pain, loss of appetite, nausea, light-colored stool, dark yellow or brown urine, yellowing skin or eyes, unusual weakness or fatigue Stroke--sudden numbness or weakness of the face, arm, or leg, trouble speaking, confusion, trouble walking, loss of balance or coordination, dizziness, severe headache, change in vision Sudden eye pain or change in vision such as blurry vision, seeing halos around lights, vision loss Unusual vaginal discharge, itching, or odor Vaginal bleeding after menopause, pelvic pain Worsening mood, feelings of depression Side effects that usually do not require medical attention (report  to your care team if they continue or are bothersome): Breast pain or tenderness Change in sex  drive or performance Headache Irregular menstrual cycles or spotting Nausea Weight gain This list may not describe all possible side effects. Call your doctor for medical advice about side effects. You may report side effects to FDA at 1-800-FDA-1088. Where should I keep my medication? Keep out of the reach of children and pets. Store at room temperature between 20 and 25 degrees C (68 and 77 degrees F). Throw away any unused medication after the expiration date. NOTE: This sheet is a summary. It may not cover all possible information. If you have questions about this medicine, talk to your doctor, pharmacist, or health care provider.  2023 Elsevier/Gold Standard (2020-03-04 00:00:00)   Spironolactone Tablets What is this medication? SPIRONOLACTONE (speer on oh LAK tone) treats high blood pressure and heart failure. It may also be used to reduce swelling related to heart, kidney, or liver disease. It helps your kidneys remove more fluid and salt from your blood through the urine without losing too much potassium. It belongs to a group of medications called diuretics. This medicine may be used for other purposes; ask your health care provider or pharmacist if you have questions. COMMON BRAND NAME(S): Aldactone What should I tell my care team before I take this medication? They need to know if you have any of these conditions: Addison's disease or low adrenal gland function High blood level of potassium Kidney disease Liver disease An unusual or allergic reaction to spironolactone, other medications, foods, dyes, or preservatives Pregnant or trying to get pregnant Breast-feeding How should I use this medication? Take this medication by mouth. Take it as directed on the prescription label at the same time every day. You can take it with or without food. You should always take it the same way. Keep taking it unless your care team tells you to stop. Talk to your care team about the use of  this medication in children. Special care may be needed. Overdosage: If you think you have taken too much of this medicine contact a poison control center or emergency room at once. NOTE: This medicine is only for you. Do not share this medicine with others. What if I miss a dose? If you miss a dose, take it as soon as you can. If it is almost time for your next dose, take only that dose. Do not take double or extra doses. What may interact with this medication? Do not take this medication with any of the following: Cidofovir Eplerenone Tranylcypromine This medication may also interact with the following: Aspirin Certain medications for blood pressure or heart disease, such as benazepril, lisinopril, losartan, valsartan Certain medications that prevent or treat blood clots, such as heparin and enoxaparin Cholestyramine Cyclosporine Digoxin Lithium Medications that relax muscles for surgery NSAIDs, medications for pain and inflammation, such as ibuprofen or naproxen Other diuretics Potassium salts or supplements Steroid medications, such as prednisone or cortisone Trimethoprim This list may not describe all possible interactions. Give your health care provider a list of all the medicines, herbs, non-prescription drugs, or dietary supplements you use. Also tell them if you smoke, drink alcohol, or use illegal drugs. Some items may interact with your medicine. What should I watch for while using this medication? Visit your care team for regular checks on your progress. Check your blood pressure as directed. Know what your blood pressure should be and when to contact your care team. Do not  treat yourself for coughs, colds, or pain while you are using this medication without asking your care team for advice. Some medications may increase your blood pressure. Check with your care team if you have severe diarrhea, nausea, and vomiting, or if you sweat a lot. The loss of too much body fluid may  make it dangerous for you to take this medication. You may need to be on a special diet while you are taking this medication. Ask your care team. Also, find out how many glasses of fluids you need to drink each day. This medication may affect your coordination, reaction time, or judgment. Do not drive or operate machinery until you know how this medication affects you. Sit up or stand slowly to reduce the risk of dizzy or fainting spells. Drinking alcohol with this medication can increase the risk of these side effects. Avoid salt substitutes unless you are told otherwise by your care team. What side effects may I notice from receiving this medication? Side effects that you should report to your care team as soon as possible: Allergic reactions--skin rash, itching, hives, swelling of the face, lips, tongue, or throat Dehydration--increased thirst, dry mouth, feeling faint or lightheaded, headache, dark yellow or brown urine High potassium level--muscle weakness, fast or irregular heartbeat Kidney injury--decrease in the amount of urine, swelling of the ankles, hands, or feet Low blood pressure--dizziness, feeling faint or lightheaded, blurry vision Low sodium level--muscle weakness, fatigue, dizziness, headache, confusion Side effects that usually do not require medical attention (report to your care team if they continue or are bothersome): Breast pain or tenderness Changes in sex drive or performance Dizziness Headache Irregular menstrual cycles or spotting Unexpected breast tissue growth This list may not describe all possible side effects. Call your doctor for medical advice about side effects. You may report side effects to FDA at 1-800-FDA-1088. Where should I keep my medication? Keep out of the reach of children and pets. Store below 25 degrees C (77 degrees F). Get rid of any unused medication after the expiration date. To get rid of medications that are no longer needed or have  expired: Take the medication to a medication take-back program. Check with your pharmacy or law enforcement to find a location. If you cannot return the medication, check the label or package insert to see if the medication should be thrown out in the garbage or flushed down the toilet. If you are not sure, ask your care team. If it is safe to put into the trash, take the medication out of the container. Mix the medication with cat litter, dirt, coffee grounds, or other unwanted substance. Seal the mixture in a bag or container. Put it in the trash. NOTE: This sheet is a summary. It may not cover all possible information. If you have questions about this medicine, talk to your doctor, pharmacist, or health care provider.  2023 Elsevier/Gold Standard (2020-04-18 00:00:00)

## 2022-03-14 DIAGNOSIS — Z1331 Encounter for screening for depression: Secondary | ICD-10-CM | POA: Diagnosis not present

## 2022-03-14 DIAGNOSIS — F3181 Bipolar II disorder: Secondary | ICD-10-CM | POA: Diagnosis not present

## 2022-03-14 DIAGNOSIS — F411 Generalized anxiety disorder: Secondary | ICD-10-CM | POA: Diagnosis not present

## 2022-03-20 LAB — TESTOS,TOTAL,FREE AND SHBG (FEMALE)
Free Testosterone: 5.2 pg/mL (ref 0.1–6.4)
Sex Hormone Binding: 7.8 nmol/L — ABNORMAL LOW (ref 17–124)
Testosterone, Total, LC-MS-MS: 23 ng/dL (ref 2–45)

## 2022-04-10 DIAGNOSIS — H52223 Regular astigmatism, bilateral: Secondary | ICD-10-CM | POA: Diagnosis not present

## 2022-04-10 DIAGNOSIS — H40053 Ocular hypertension, bilateral: Secondary | ICD-10-CM | POA: Diagnosis not present

## 2022-04-10 DIAGNOSIS — R519 Headache, unspecified: Secondary | ICD-10-CM | POA: Diagnosis not present

## 2022-04-11 DIAGNOSIS — Z1339 Encounter for screening examination for other mental health and behavioral disorders: Secondary | ICD-10-CM | POA: Diagnosis not present

## 2022-04-11 DIAGNOSIS — F3181 Bipolar II disorder: Secondary | ICD-10-CM | POA: Diagnosis not present

## 2022-04-11 DIAGNOSIS — F411 Generalized anxiety disorder: Secondary | ICD-10-CM | POA: Diagnosis not present

## 2022-04-29 DIAGNOSIS — R519 Headache, unspecified: Secondary | ICD-10-CM | POA: Diagnosis not present

## 2022-04-29 DIAGNOSIS — H4711 Papilledema associated with increased intracranial pressure: Secondary | ICD-10-CM | POA: Diagnosis not present

## 2022-05-20 DIAGNOSIS — F411 Generalized anxiety disorder: Secondary | ICD-10-CM | POA: Diagnosis not present

## 2022-05-20 DIAGNOSIS — F3181 Bipolar II disorder: Secondary | ICD-10-CM | POA: Diagnosis not present

## 2022-05-20 DIAGNOSIS — Z1331 Encounter for screening for depression: Secondary | ICD-10-CM | POA: Diagnosis not present

## 2022-06-18 DIAGNOSIS — F3181 Bipolar II disorder: Secondary | ICD-10-CM | POA: Diagnosis not present

## 2022-06-18 DIAGNOSIS — F411 Generalized anxiety disorder: Secondary | ICD-10-CM | POA: Diagnosis not present

## 2022-07-09 NOTE — Progress Notes (Unsigned)
NEUROLOGY FOLLOW UP OFFICE NOTE  Carrie Wright [id number]  Assessment/Plan:   1  Idiopathic intracranial hypertension 2 Migraine with aura, without status migrainosus, not intractable   Continue acetazolamide 750mg  twice daily Refer to ophthalmology for formal eye exam Follow up 4 to 5 months.     Subjective:  Carrie Wright is a 22 year old right-handed female with asthma, PCOS, DM II, Bipolar 2 disorder and migraines who follows up for idiopathic intracranial hypertension and migraines.   UPDATE: Current medications:  acetazolamide 750mg  BID  She had eye exam at [facility] on 4/16     HISTORY:  Symptoms started in 2020.  She reports what sounds like blood rushing in her head.  She also reports fuzzy vision in her periphery and sometimes will have static that blocks her entire vision.  She has longstanding history of migraines since childhood in band-like distribution with visual aura (flashes of color), nausea, photophobia.  They are infrequent. However, she developed new headaches which are right sided or left sided without associated symptoms, worse when standing or moving around.  She also notes pain and numbness down the right arm to the index finger.  Neck pain.  She saw the eye doctor this month and was found to have bilateral papilledema.  MRI of brain with and without contrast on 04/30/2021 was normal.  MRV of head on 05/16/2021 personally reviewed showed hypoplastic but patent left transverse sinus and filling defect in the right jugular bulb representing artifact or thrombus.  Unable to obtain IV access to confirm.  She had a follow up CTV of head on 05/30/2021 which did not reveal thrombosis but did demonstrate new left maxillary sinus mucosal thickening.  She didn't follow up with her PCP but she is feeling well.  She underwent LP on 05/22/2021 which demonstrate elevated opening pressure of 42 cm of water.  Over that past year, she reported 20 lb weight gain.  No  birth control.         Past NSAIDS/analgesics:  naproxen Past abortive triptans:  eletriptan, sumatriptan tab, rizatriptan Past abortive ergotamine:  none Past muscle relaxants:  tizanidine Past anti-emetic:  Zofran, Phenergan Past antihypertensive medications:  none Past antidepressant/antipsychotic medications:  Wellbutrin, Vraylar Past anticonvulsant medications:  Depakote, topiramate Past anti-CGRP:  none Past antihistamines/decongestants:  Benadryl  PAST MEDICAL HISTORY: Past Medical History:  Diagnosis Date   Allergy    Asthma    Bipolar 2 disorder (HCC)    Migraine    Migraine    Suicidal behavior with attempted self-injury [facility])     MEDICATIONS: Current Outpatient Medications on File Prior to Visit  Medication Sig Dispense Refill   acetaminophen (TYLENOL) 325 MG tablet Take by mouth.     acetaZOLAMIDE (DIAMOX) 250 MG tablet Take 3 tablets (750 mg total) by mouth 2 (two) times daily. 180 tablet 5   albuterol (VENTOLIN HFA) 108 (90 Base) MCG/ACT inhaler Inhale 2 puffs into the lungs every 6 (six) hours as needed for wheezing or shortness of breath (coughing). 18 g 3   budesonide-formoterol (SYMBICORT) 160-4.5 MCG/ACT inhaler Inhale 2 puffs into the lungs 2 (two) times daily. 1 each 3   lamoTRIgine (LAMICTAL) 25 MG tablet Take 25 mg by mouth daily.     medroxyPROGESTERone (PROVERA) 5 MG tablet Take 1 tablet (5 mg total) by mouth daily. Take one tablet by mouth daily for 5 days in a row at the beginning of each month if you do not have a spontaneous  menstrual cycle. 15 tablet 3   triamcinolone ointment (KENALOG) 0.5 % Apply 1 application. topically 2 (two) times daily. 30 g 0   No current facility-administered medications on file prior to visit.    ALLERGIES: Allergies  Allergen Reactions   Gardasil [Hpv 4-Valent Vaccine Recombinant Vaccine] Rash    FAMILY HISTORY: Family History  Problem Relation Age of Onset   Cancer Mother    Migraines Mother    Depression  Mother    Melanoma Mother    Migraines Father    Migraines Maternal Grandmother    Schizophrenia Maternal Grandfather    Breast cancer Paternal Grandmother    Colon cancer Maternal Great-grandmother       Objective:  *** General: No acute distress.  Patient appears well-groomed.   Head:  Normocephalic/atraumatic Eyes:  Fundi examined but not visualized Neck: supple, no paraspinal tenderness, full range of motion Heart:  Regular rate and rhythm Neurological Exam: ***   Shon Millet, DO  CC: Everlene Other, DO

## 2022-07-10 ENCOUNTER — Encounter: Payer: Self-pay | Admitting: Neurology

## 2022-07-10 ENCOUNTER — Ambulatory Visit: Payer: BC Managed Care – PPO | Admitting: Neurology

## 2022-07-10 VITALS — BP 131/86 | HR 88 | Ht 65.0 in | Wt 228.0 lb

## 2022-07-10 DIAGNOSIS — G932 Benign intracranial hypertension: Secondary | ICD-10-CM | POA: Diagnosis not present

## 2022-07-10 NOTE — Patient Instructions (Signed)
Follow up with the eye doctor (Dr. Hanley Ben at [facility]) Follow up with me in 6 months

## 2022-07-29 DIAGNOSIS — F3181 Bipolar II disorder: Secondary | ICD-10-CM | POA: Diagnosis not present

## 2022-07-29 DIAGNOSIS — Z1339 Encounter for screening examination for other mental health and behavioral disorders: Secondary | ICD-10-CM | POA: Diagnosis not present

## 2022-07-29 DIAGNOSIS — F411 Generalized anxiety disorder: Secondary | ICD-10-CM | POA: Diagnosis not present

## 2022-08-05 ENCOUNTER — Telehealth: Payer: Self-pay

## 2022-08-05 NOTE — Telephone Encounter (Signed)
Printed Shot Record. Shot record given to front desk and ready for pick up

## 2022-08-05 NOTE — Telephone Encounter (Signed)
Pt is coming by today to pick up copy of immunization records

## 2022-08-22 DIAGNOSIS — Z111 Encounter for screening for respiratory tuberculosis: Secondary | ICD-10-CM | POA: Diagnosis not present

## 2022-08-26 DIAGNOSIS — F411 Generalized anxiety disorder: Secondary | ICD-10-CM | POA: Diagnosis not present

## 2022-08-26 DIAGNOSIS — F3181 Bipolar II disorder: Secondary | ICD-10-CM | POA: Diagnosis not present

## 2022-09-23 DIAGNOSIS — F3181 Bipolar II disorder: Secondary | ICD-10-CM | POA: Diagnosis not present

## 2022-09-23 DIAGNOSIS — F411 Generalized anxiety disorder: Secondary | ICD-10-CM | POA: Diagnosis not present

## 2022-10-01 ENCOUNTER — Telehealth: Payer: Self-pay

## 2022-10-01 NOTE — Telephone Encounter (Signed)
Pt reported having to change hospital pads in 6 hours initially and then doubled maxi pads in 3-4 hours.  States today bleeding has lightened but still there and still having some slotting but clots were as big as slightly bigger than a 50 cent piece.  Pt returned call today and reported   LMP: 09/11/2022  PCOS. Intracranial HTN. Psych for PMDD, on lamictal.  Pt states she has seen neurologist since being seen here but didn't remember to discuss spironolactone medication with them.  OV to address current concern or pelvis US?  Please advise.

## 2022-10-01 NOTE — Telephone Encounter (Signed)
I recommend an office visit

## 2022-10-01 NOTE — Telephone Encounter (Signed)
Msg sent to appt desk.  

## 2022-10-01 NOTE — Telephone Encounter (Signed)
Pt LVM in triage line stating that she has been experiencing "profuse bleeding post coital" since 09/25/2022 and no having large clots. Denied any other sxs.   LVMTCB.

## 2022-10-07 NOTE — Telephone Encounter (Signed)
Patient called & stated she has stopped bleeding. Patient stated she stopped 2-3 days ago & feels fine with no problems just only slight cramping that feels like her ovulation cramping. Appt was scheduled for 10-21-22 with Dr Edward Jolly for evaluation. Patient wanted to know if you still thinks she needs to keep this appointment. Please advise Routing to Dr. Edward Jolly

## 2022-10-07 NOTE — Telephone Encounter (Signed)
Please have patient keep her appointment with me.

## 2022-10-08 NOTE — Telephone Encounter (Signed)
Call returned to patient, left detailed message, ok per dpr. Advised per Dr. Edward Jolly. Return call to office if any additional questions.   Encounter closed.

## 2022-10-09 NOTE — Progress Notes (Signed)
GYNECOLOGY  VISIT   HPI: 22 y.o.   Significant Other    female   [id number] with Patient's last menstrual period was 09/18/2022.   here for   bleeding. Had a lot of bleeding after IC 9/13 for about a week. Thought this blood looked fresh with clots. Pt has reported what feels like ovulation cramps.  Has had bleeding with sex, but not for many years.   Has a new partner who has a vagina.  Digital penetration, no instrumentation.  Thinks that the bleeding may have been related to a fingernail. No pain with sex.   Not sexually active with men.  Had serum testing for STDs 2 weeks ago and this was negative to her knowledge.  Has not had vaginal STD screening.   Denies vaginal discharge.  Skipped 2 menstrual cycles since visit in February.   No pain with urination.  No blood in the urine.   GYNECOLOGIC HISTORY: Patient's last menstrual period was 09/18/2022. 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   Type 2 diabetes mellitus without complications (HCC) 02/15/2017   PCOS (polycystic ovarian syndrome) 07/10/2015   Bipolar I disorder, most recent episode depressed (HCC) 05/13/2015   Suicide attempt by acetaminophen overdose (HCC) 05/11/2015   Migraine without aura 02/25/2012    Past Medical History:  Diagnosis Date   Allergy    Asthma    Bipolar 2 disorder (HCC)    Migraine    Migraine    Suicidal behavior with attempted self-injury [facility])     Past Surgical History:  Procedure Laterality Date   FOOT SURGERY      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     albuterol (VENTOLIN HFA) 108 (90 Base) MCG/ACT inhaler Inhale 2 puffs into the lungs every 6 (six) hours as needed for wheezing or shortness of breath (coughing). 18 g 3   lamoTRIgine (LAMICTAL XR) 100 MG 24 hour tablet Take 100 mg by mouth at bedtime.     lamoTRIgine (LAMICTAL XR) 50 MG 24 hour tablet Take 50 mg by mouth daily.     triamcinolone ointment (KENALOG) 0.5 % Apply 1 application. topically 2 (two) times daily. 30 g 0   acetaZOLAMIDE (DIAMOX) 250 MG tablet Take 3 tablets (750 mg total) by mouth 2 (two) times daily. (Patient not taking: Reported on 07/10/2022) 180 tablet 5   budesonide-formoterol (SYMBICORT) 160-4.5 MCG/ACT inhaler Inhale 2 puffs into the lungs 2 (two) times daily. (Patient not taking: Reported on 07/10/2022) 1 each 3   No current facility-administered medications  for this visit.     ALLERGIES: Gardasil [human papillomavirus 4-valent recombinant vaccine]  Family History  Problem Relation Age of Onset   Cancer Mother    Migraines Mother    Depression Mother    Melanoma Mother    Migraines Father    Migraines Maternal Grandmother    Schizophrenia Maternal Grandfather    Breast cancer Paternal Grandmother    Colon cancer Maternal Great-grandmother     Social History   Socioeconomic History   Marital status: Significant Other    Spouse name: Not on file   Number of children: 0   Years of education: Not on file   Highest education level: Not on file  Occupational History   Not on file  Tobacco Use   Smoking status: Former    Current packs/day: 0.25    Average packs/day: 0.3 packs/day for 3.0 years (0.8 ttl pk-yrs)    Types: Cigarettes   Smokeless tobacco: Never  Vaping Use   Vaping status: Never Used  Substance and Sexual Activity   Alcohol  use: Yes    Alcohol/week: 5.0 standard drinks of alcohol    Types: 5 Standard drinks or equivalent per week    Comment: occasionally 2 times a month   Drug use: Yes    Types: Marijuana    Comment: 3.5 grams per week (3 times per week)   Sexual activity: Yes    Birth control/protection: None    Comment: has had same sex partner  Other Topics Concern   Not on file  Social History Narrative   Lives at home with mother, father, no siblings.  Has 2 cats and 1 dog inside the house.      Right Handed    Drinks caffeine    Social Determinants of Health   Financial Resource Strain: Not on file  Food Insecurity: Not on file  Transportation Needs: Not on file  Physical Activity: Not on file  Stress: Not on file  Social Connections: Not on file  Intimate Partner Violence: Not on file    Review of Systems  Genitourinary:  Positive for vaginal bleeding.    PHYSICAL EXAMINATION:    BP 126/74 (BP Location: Left Arm, Patient Position: Sitting, Cuff Size: Normal)   Pulse 80   Ht 5\' 5"  (1.651 m)   Wt 202 lb (91.6 kg)   LMP 09/18/2022   SpO2 98%   BMI 33.61 kg/m     General appearance: alert, cooperative and appears stated age   Pelvic: External genitalia:  no lesions              Urethra:  normal appearing urethra with no masses, tenderness or lesions              Bartholins and Skenes: normal                 Vagina: normal appearing vagina with normal color and discharge, no lesions              Cervix: no lesions                Bimanual Exam:  Uterus:  normal size, contour, position, consistency, mobility, non-tender              Adnexa: no mass, fullness, tenderness   Chaperone was present for exam:  Warren Lacy, CMA  ASSESSMENT  Post coital bleeding.  I suspect this came from a small laceration to the cervix or vagina, which has since healed.  Migraine HA with possible aura. Idiopathic intracranial hypertension.   Papilledema.  DM.  PLAN  STD screening for  GC/CT/trichomonas.  Reassurance given regarding pelvic exam today. If recurrent postcoital bleeding or abnormal menstruation occurs, I would plan for a pelvic ultrasound.  Fu for annual exam in February, 2025 and prn.    26 min  total time was spent for this patient encounter, including preparation, face-to-face counseling with the patient, coordination of care, and documentation of the encounter.

## 2022-10-21 ENCOUNTER — Encounter: Payer: Self-pay | Admitting: Obstetrics and Gynecology

## 2022-10-21 ENCOUNTER — Other Ambulatory Visit (HOSPITAL_COMMUNITY)
Admission: RE | Admit: 2022-10-21 | Discharge: 2022-10-21 | Disposition: A | Discharge status: Home / Self Care | Payer: BC Managed Care – PPO | Source: Ambulatory Visit | Attending: Obstetrics and Gynecology | Admitting: Obstetrics and Gynecology

## 2022-10-21 ENCOUNTER — Ambulatory Visit (INDEPENDENT_AMBULATORY_CARE_PROVIDER_SITE_OTHER): Payer: BC Managed Care – PPO | Admitting: Obstetrics and Gynecology

## 2022-10-21 VITALS — BP 126/74 | HR 80 | Ht 65.0 in | Wt 202.0 lb

## 2022-10-21 DIAGNOSIS — Z113 Encounter for screening for infections with a predominantly sexual mode of transmission: Secondary | ICD-10-CM

## 2022-10-21 DIAGNOSIS — N930 Postcoital and contact bleeding: Secondary | ICD-10-CM | POA: Diagnosis not present

## 2022-10-22 LAB — CERVICOVAGINAL ANCILLARY ONLY
Chlamydia: NEGATIVE
Comment: NEGATIVE
Comment: NEGATIVE
Comment: NORMAL
Neisseria Gonorrhea: NEGATIVE
Trichomonas: NEGATIVE

## 2022-10-27 ENCOUNTER — Ambulatory Visit (INDEPENDENT_AMBULATORY_CARE_PROVIDER_SITE_OTHER): Payer: BC Managed Care – PPO | Admitting: Nurse Practitioner

## 2022-10-27 ENCOUNTER — Encounter: Payer: Self-pay | Admitting: Nurse Practitioner

## 2022-10-27 VITALS — BP 114/58 | HR 75 | Temp 97.9°F | Ht 65.0 in | Wt 204.0 lb

## 2022-10-27 DIAGNOSIS — L209 Atopic dermatitis, unspecified: Secondary | ICD-10-CM

## 2022-10-27 DIAGNOSIS — F909 Attention-deficit hyperactivity disorder, unspecified type: Secondary | ICD-10-CM

## 2022-10-27 DIAGNOSIS — F3130 Bipolar disorder, current episode depressed, mild or moderate severity, unspecified: Secondary | ICD-10-CM | POA: Diagnosis not present

## 2022-10-27 NOTE — Progress Notes (Unsigned)
   Subjective:    Patient ID: Carrie Wright, female    DOB: 07-09-2000, 22 y.o.   MRN: [id number]  HPI  Refill for lamictal 150 mg , due to she prefers not to see the previous prescriber  Concerns with eczema - nothing really worked int he past  Trouble with concentrating on school work , with sleeping and eating   Review of Systems     Objective:   Physical Exam        Assessment & Plan:

## 2022-10-27 NOTE — Patient Instructions (Addendum)
Unscented Dove soap CeraVe cream  Atopic Dermatitis Atopic dermatitis is a skin disorder that causes inflammation of the skin. It is marked by a red rash and itchy, dry, scaly skin. It is the most common type of eczema. Eczema is a group of skin conditions that cause the skin to become rough and swollen. This condition is generally worse during the cooler winter months and often improves during the warm summer months. Atopic dermatitis usually starts showing signs in infancy and can last through adulthood. This condition cannot be passed from one person to another (is not contagious). Atopic dermatitis may not always be present, but when it is, it is called a flare-up. What are the causes? The exact cause of this condition is not known. Flare-ups may be triggered by: Coming in contact with something that you are sensitive or allergic to (allergen). Stress. Certain foods. Extremely hot or cold weather. Harsh chemicals and soaps. Dry air. Chlorine. What increases the risk? This condition is more likely to develop in people who have a personal or family history of: Eczema. Allergies. Asthma. Hay fever. What are the signs or symptoms? Symptoms of this condition include: Dry, scaly skin. Red, itchy rash. Itchiness, which can be severe. This may occur before the skin rash. This can make sleeping difficult. Skin thickening and cracking that can occur over time. How is this diagnosed? This condition is diagnosed based on: Your symptoms. Your medical history. A physical exam. How is this treated? There is no cure for this condition, but symptoms can usually be controlled. Treatment focuses on: Controlling the itchiness and scratching. You may be given medicines, such as antihistamines or steroid creams. Limiting exposure to allergens. Recognizing situations that cause stress and developing a plan to manage stress. If your atopic dermatitis does not get better with medicines, or if it is  all over your body (widespread), a treatment using a specific type of light (phototherapy) may be used. Follow these instructions at home: Skin care  Keep your skin well moisturized. Doing this seals in moisture and helps to prevent dryness. Use unscented lotions that have petroleum in them. Avoid lotions that contain alcohol or water. They can dry the skin. Keep baths or showers short (less than 5 minutes) in warm water. Do not use hot water. Use mild, unscented cleansers for bathing. Avoid soap and bubble bath. Apply a moisturizer to your skin right after a bath or shower. Do not apply anything to your skin without checking with your health care provider. General instructions Take or apply over-the-counter and prescription medicines only as told by your health care provider. Dress in clothes made of cotton or cotton blends. Dress lightly because heat increases itchiness. When washing your clothes, rinse your clothes twice so all of the soap is removed. Avoid any triggers that can cause a flare-up. Keep your fingernails cut short. Avoid scratching. Scratching makes the rash and itchiness worse. A break in the skin from scratching could result in a skin infection (impetigo). Do not be around people who have cold sores or fever blisters. If you get the infection, it may cause your atopic dermatitis to worsen. Keep all follow-up visits. This is important. Contact a health care provider if: Your itchiness interferes with sleep. Your rash gets worse or is not better within one week of starting treatment. You have a fever. You have a rash flare-up after having contact with someone who has cold sores or fever blisters. Get help right away if: You develop pus or  soft yellow scabs in the rash area. Summary Atopic dermatitis causes a red rash and itchy, dry, scaly skin. Treatment focuses on controlling the itchiness and scratching, limiting exposure to things that you are sensitive or allergic to  (allergens), recognizing situations that cause stress, and developing a plan to manage stress. Keep your skin well moisturized. Keep baths or showers shorter than 5 minutes and use warm water. Do not use hot water. This information is not intended to replace advice given to you by your health care provider. Make sure you discuss any questions you have with your health care provider. Document Revised: 10/10/2019 Document Reviewed: 10/10/2019 Elsevier Patient Education  2023 [employer].

## 2022-10-29 ENCOUNTER — Encounter: Payer: Self-pay | Admitting: Nurse Practitioner

## 2022-10-29 DIAGNOSIS — F909 Attention-deficit hyperactivity disorder, unspecified type: Secondary | ICD-10-CM | POA: Insufficient documentation

## 2022-10-29 MED ORDER — LAMOTRIGINE ER 100 MG PO TB24: 100.0000 mg | ORAL_TABLET | Freq: Every day | ORAL | 0 refills | Status: DC

## 2022-10-29 MED ORDER — LAMOTRIGINE ER 50 MG PO TB24: 50.0000 mg | ORAL_TABLET | Freq: Every day | ORAL | 0 refills | Status: DC

## 2022-10-29 MED ORDER — GUANFACINE HCL ER 1 MG PO TB24: 1.0000 mg | ORAL_TABLET | Freq: Every day | ORAL | 0 refills | Status: DC

## 2022-10-29 MED ORDER — CLOBETASOL PROPIONATE 0.05 % EX CREA: 1.0000 | TOPICAL_CREAM | Freq: Two times a day (BID) | CUTANEOUS | 0 refills | Status: DC

## 2023-01-09 NOTE — Progress Notes (Unsigned)
NEUROLOGY FOLLOW UP OFFICE NOTE  Carrie Wright [id number]  Assessment/Plan:   Tension type headaches Idiopathic intracranial hypertension    Although these headaches appear more consistent with tension-type headache, advised to get repeat eye exam and have results sent to me.  If evidence of papilledema, would start furosemide Coffee cessation, increase water intake, continue exercise.  She cannot tolerate muscle relaxants.  Caution starting antidepressant as to not interfere management of her Bipolar disorder Follow up 6 months.     Subjective:  Carrie Wright is a 22 year old right-handed female with asthma, PCOS, DM II, Bipolar 2 disorder and migraines who follows up for idiopathic intracranial hypertension and migraines.   UPDATE: Current medications:  lamotrigine XR 150mg  daily  Recurrence of headaches 2 to 3 weeks ago.  They are 5/10 unilateral either side, pressure, when she is upright.  No nausea, photophobia or phonophobia.  Not positional.  They last 45 to 60 minutes and will return in a few hours.  They are daily.  No visual disturbance.  She sometimes will notice pulsatile tinnitus associated with elevated blood pressure.  Not treating them.  She does endorse increased stress, aggravation of her Bipolar disorder, lack of sleep and drinking more coffee.  Last eye exam at [facility] during the summer did not reveal any papilledema.       HISTORY:  Symptoms started in 2020.  She reports what sounds like blood rushing in her head.  She also reports fuzzy vision in her periphery and sometimes will have static that blocks her entire vision.  She has longstanding history of migraines since childhood in band-like distribution with visual aura (flashes of color), nausea, photophobia.  They are infrequent. However, she developed new headaches which are right sided or left sided without associated symptoms, worse when standing or moving around.  She also notes pain and  numbness down the right arm to the index finger.  Neck pain.  She saw the eye doctor in April 2023 and was found to have bilateral papilledema.  She did report a 20 lb weight gain over the previous year.  MRI of brain with and without contrast on 04/30/2021 was normal.  MRV of head on 05/16/2021 personally reviewed showed hypoplastic but patent left transverse sinus and filling defect in the right jugular bulb representing artifact or thrombus.  Unable to obtain IV access to confirm.  She had a follow up CTV of head on 05/30/2021 which did not reveal thrombosis but did demonstrate new left maxillary sinus mucosal thickening.  She didn't follow up with her PCP but she is feeling well.  She underwent LP on 05/22/2021 which demonstrate elevated opening pressure of 42 cm of water.  No birth control.         Past NSAIDS/analgesics:  naproxen Past abortive triptans:  eletriptan, sumatriptan tab, rizatriptan Past abortive ergotamine:  none Past muscle relaxants:  tizanidine Past anti-emetic:  Zofran, Phenergan Past antihypertensive medications:  none Past antidepressant/antipsychotic medications:  Wellbutrin, Vraylar Past anticonvulsant medications:  Depakote, topiramate Past anti-CGRP:  none Past antihistamines/decongestants:  Benadryl  PAST MEDICAL HISTORY: Past Medical History:  Diagnosis Date   Allergy    Asthma    Bipolar 2 disorder (HCC)    Migraine    Migraine    Suicidal behavior with attempted self-injury [facility])     MEDICATIONS: Current Outpatient Medications on File Prior to Visit  Medication Sig Dispense Refill   acetaminophen (TYLENOL) 325 MG tablet Take by mouth.  acetaZOLAMIDE (DIAMOX) 250 MG tablet Take 3 tablets (750 mg total) by mouth 2 (two) times daily. 180 tablet 5   albuterol (VENTOLIN HFA) 108 (90 Base) MCG/ACT inhaler Inhale 2 puffs into the lungs every 6 (six) hours as needed for wheezing or shortness of breath (coughing). 18 g 3   budesonide-formoterol (SYMBICORT)  160-4.5 MCG/ACT inhaler Inhale 2 puffs into the lungs 2 (two) times daily. 1 each 3   clobetasol cream (TEMOVATE) 0.05 % Apply 1 Application topically 2 (two) times daily. 30 g 0   guanFACINE (INTUNIV) 1 MG TB24 ER tablet Take 1 tablet (1 mg total) by mouth daily. 30 tablet 0   lamoTRIgine (LAMICTAL XR) 100 MG 24 hour tablet Take 1 tablet (100 mg total) by mouth at bedtime. 90 tablet 0   lamoTRIgine (LAMICTAL XR) 50 MG 24 hour tablet Take 1 tablet (50 mg total) by mouth daily. 90 tablet 0   No current facility-administered medications on file prior to visit.    ALLERGIES: Allergies  Allergen Reactions   Gardasil [Human Papillomavirus 4-Valent Recombinant Vaccine] Rash    FAMILY HISTORY: Family History  Problem Relation Age of Onset   Cancer Mother    Migraines Mother    Depression Mother    Melanoma Mother    Migraines Father    Migraines Maternal Grandmother    Schizophrenia Maternal Grandfather    Breast cancer Paternal Grandmother    Colon cancer Maternal Great-grandmother       Objective:  Blood pressure 128/80, pulse 82, resp. rate 18, height 5\' 5"  (1.651 m), SpO2 100%. General: No acute distress.  Patient appears well-groomed.   Head:  Normocephalic/atraumatic Eyes:  Fundi examined but not visualized Neck: supple, no paraspinal tenderness, full range of motion Heart:  Regular rate and rhythm Neurological Exam: alert and oriented.  Speech fluent and not dysarthric, language intact.  CN II-XII intact. Bulk and tone normal, muscle strength 5/5 throughout.  Sensation to light touch intact.  Deep tendon reflexes 2+ throughout.  Finger to nose testing intact.  Gait normal, Romberg negative.   Shon Millet, DO  CC: Everlene Other, DO

## 2023-01-12 ENCOUNTER — Encounter: Payer: Self-pay | Admitting: Neurology

## 2023-01-12 ENCOUNTER — Ambulatory Visit (INDEPENDENT_AMBULATORY_CARE_PROVIDER_SITE_OTHER): Payer: BC Managed Care – PPO | Admitting: Neurology

## 2023-01-12 VITALS — BP 128/80 | HR 82 | Resp 18 | Ht 65.0 in

## 2023-01-12 DIAGNOSIS — G932 Benign intracranial hypertension: Secondary | ICD-10-CM

## 2023-01-12 DIAGNOSIS — G44209 Tension-type headache, unspecified, not intractable: Secondary | ICD-10-CM

## 2023-01-12 NOTE — Patient Instructions (Signed)
Get repeat eye exam Discontinue coffee Increase water intake Continue exercise Follow up 6 months.

## 2023-01-27 ENCOUNTER — Encounter: Payer: Self-pay | Admitting: Neurology

## 2023-02-17 ENCOUNTER — Ambulatory Visit: Payer: BC Managed Care – PPO | Admitting: Dermatology

## 2023-02-26 ENCOUNTER — Encounter: Payer: Self-pay | Admitting: Nurse Practitioner

## 2023-02-26 ENCOUNTER — Ambulatory Visit: Payer: BC Managed Care – PPO | Admitting: Nurse Practitioner

## 2023-02-26 VITALS — BP 144/85 | HR 83 | Temp 98.1°F | Ht 65.0 in | Wt 203.0 lb

## 2023-02-26 DIAGNOSIS — G8929 Other chronic pain: Secondary | ICD-10-CM

## 2023-02-26 DIAGNOSIS — F3130 Bipolar disorder, current episode depressed, mild or moderate severity, unspecified: Secondary | ICD-10-CM | POA: Diagnosis not present

## 2023-02-26 DIAGNOSIS — L209 Atopic dermatitis, unspecified: Secondary | ICD-10-CM

## 2023-02-26 DIAGNOSIS — M25512 Pain in left shoulder: Secondary | ICD-10-CM

## 2023-02-26 DIAGNOSIS — F909 Attention-deficit hyperactivity disorder, unspecified type: Secondary | ICD-10-CM

## 2023-02-26 MED ORDER — AMPHETAMINE-DEXTROAMPHET ER 10 MG PO CP24: 10.0000 mg | ORAL_CAPSULE | Freq: Every day | ORAL | 0 refills | Status: DC

## 2023-02-26 MED ORDER — LAMOTRIGINE ER 100 MG PO TB24: 100.0000 mg | ORAL_TABLET | Freq: Every day | ORAL | 0 refills | Status: DC

## 2023-02-26 MED ORDER — CLOBETASOL PROPIONATE 0.05 % EX CREA: 1.0000 | TOPICAL_CREAM | Freq: Two times a day (BID) | CUTANEOUS | 0 refills | Status: AC

## 2023-02-26 MED ORDER — LAMOTRIGINE ER 50 MG PO TB24: 50.0000 mg | ORAL_TABLET | Freq: Every day | ORAL | 0 refills | Status: DC

## 2023-02-27 ENCOUNTER — Encounter: Payer: Self-pay | Admitting: Nurse Practitioner

## 2023-02-27 NOTE — Progress Notes (Signed)
Subjective:    Patient ID: Carrie Wright, female    DOB: [birth year], 23 y.o.   MRN: [id number]  HPI Presents for several issues.  Has had chronic popping and pain in the left shoulder area for about 2 years.  No specific history of injury.  Requesting referral to orthopedic specialist for evaluation. Has had some loss of hair on parts of both legs.  Concerned about blood flow and capillary refill.  Also has a history of atopic dermatitis/eczema, requesting a refill on her clobetasol cream. Has been struggling in nursing school.  States she barely passed last semester and is currently failing her courses.  States she is having difficulty focusing and staying on task.  Has a history of adult ADHD.  States she has received excellent support through the program but still struggling with testing and studying. Recently ended a difficult relationship with her female partner.  Has been struggling some since then.  States Lamictal is working very well for her bipolar disorder.  Denies any suicidal or homicidal thoughts or ideation.  Denies any self-harm behaviors.   Review of Systems  Constitutional:  Positive for fatigue.  Respiratory:  Negative for cough, chest tightness and shortness of breath.   Cardiovascular:  Negative for chest pain.  Psychiatric/Behavioral:  Positive for decreased concentration and sleep disturbance. Negative for self-injury and suicidal ideas. The patient is nervous/anxious.    Social History   Tobacco Use   Smoking status: Former    Current packs/day: 0.25    Average packs/day: 0.3 packs/day for 3.0 years (0.8 ttl pk-yrs)    Types: Cigarettes   Smokeless tobacco: Never  Vaping Use   Vaping status: Never Used  Substance Use Topics   Alcohol use: Yes    Alcohol/week: 5.0 standard drinks of alcohol    Types: 5 Standard drinks or equivalent per week    Comment: occasionally 2 times a month   Drug use: Yes    Types: Marijuana    Comment: 3.5 grams per week (3 times  per week)        Objective:   Physical Exam NAD.  Alert, oriented.  Tearful at times when talking about school.  Making good eye contact.  Speech clear.  Dressed appropriately for the weather.  Overall calm affect.  Thoughts logical coherent and relevant.  Normal judgment.  Lungs clear.  Heart regular rate rhythm.  Hair loss noted on the lateral parts of both legs from thighs to ankles.  Tiny hairs noted at the follicles.  A few discrete patches of eczema noted.  Hyperpigmented dry minimally raised rash noted in the upper inner thighs bilaterally.  A patch of nonerythematous atopic dermatitis noted in the flexor aspect of the left elbow.  Skin is very dry in general particularly on the hands where she uses hand sanitizer frequently.  Toes are cool but normal capillary refill.  Nailbeds of the hands normal.  DP pulses present bilaterally in the feet. Today's Vitals   02/26/23 1059  BP: (!) 144/85  Pulse: 83  Temp: 98.1 F (36.7 C)  SpO2: 100%  Weight: 203 lb (92.1 kg)  Height: 5\' 5"  (1.651 m)   Body mass index is 33.78 kg/m. Note that the adult ASRS was completed again today and was positive again for adult ADHD.     Assessment & Plan:   Problem List Items Addressed This Visit       Musculoskeletal and Integument   Atopic dermatitis     Other  Adult ADHD   Bipolar I disorder, most recent episode depressed (HCC) - Primary   Other Visit Diagnoses       Chronic left shoulder pain       Relevant Medications   lamoTRIgine (LAMICTAL XR) 100 MG 24 hour tablet   lamoTRIgine (LAMICTAL XR) 50 MG 24 hour tablet   Other Relevant Orders   Ambulatory referral to Orthopedic Surgery      Meds ordered this encounter  Medications   clobetasol cream (TEMOVATE) 0.05 %    Sig: Apply 1 Application topically 2 (two) times daily.    Dispense:  30 g    Refill:  0   lamoTRIgine (LAMICTAL XR) 100 MG 24 hour tablet    Sig: Take 1 tablet (100 mg total) by mouth at bedtime.    Dispense:  90  tablet    Refill:  0   lamoTRIgine (LAMICTAL XR) 50 MG 24 hour tablet    Sig: Take 1 tablet (50 mg total) by mouth daily.    Dispense:  90 tablet    Refill:  0   amphetamine-dextroamphetamine (ADDERALL XR) 10 MG 24 hr capsule    Sig: Take 1 capsule (10 mg total) by mouth daily.    Dispense:  30 capsule    Refill:  0    Supervising Provider:   Lilyan Punt A [[id number]]   Referred to orthopedic specialist for evaluation of chronic shoulder pain. Clobetasol cream as directed twice daily up to 2 weeks at a time and 1 area for eczema/atopic dermatitis. Discussed care for dry skin.  Avoid prolonged showers or baths particular with hot water.  Recommend CeraVe daily to dry skin. Circulation does not appear to be a problem.  It is unclear about the hair loss on her legs which has a distinct pattern.  Recheck if persists. Start Adderall XR 10 mg daily for her ADHD.  Will start with very low-dose with plans to titrate if needed and tolerated.  Discontinue medication and contact office if any adverse effects. Reminded patient about checkup for her diabetes.  Recommend office visit with labs in the near future. Return in about 3 months (around 05/26/2023).

## 2023-04-09 ENCOUNTER — Encounter: Payer: Self-pay | Admitting: Dermatology

## 2023-04-09 ENCOUNTER — Ambulatory Visit: Payer: BC Managed Care – PPO | Admitting: Dermatology

## 2023-04-09 DIAGNOSIS — L680 Hirsutism: Secondary | ICD-10-CM | POA: Diagnosis not present

## 2023-04-09 DIAGNOSIS — L209 Atopic dermatitis, unspecified: Secondary | ICD-10-CM | POA: Diagnosis not present

## 2023-04-09 DIAGNOSIS — Z808 Family history of malignant neoplasm of other organs or systems: Secondary | ICD-10-CM

## 2023-04-09 DIAGNOSIS — L731 Pseudofolliculitis barbae: Secondary | ICD-10-CM

## 2023-04-09 DIAGNOSIS — D229 Melanocytic nevi, unspecified: Secondary | ICD-10-CM

## 2023-04-09 DIAGNOSIS — Z7189 Other specified counseling: Secondary | ICD-10-CM

## 2023-04-09 DIAGNOSIS — D2262 Melanocytic nevi of left upper limb, including shoulder: Secondary | ICD-10-CM | POA: Diagnosis not present

## 2023-04-09 DIAGNOSIS — L738 Other specified follicular disorders: Secondary | ICD-10-CM

## 2023-04-09 DIAGNOSIS — L810 Postinflammatory hyperpigmentation: Secondary | ICD-10-CM

## 2023-04-09 MED ORDER — TACROLIMUS 0.1 % EX OINT: TOPICAL_OINTMENT | Freq: Two times a day (BID) | CUTANEOUS | 0 refills | Status: AC

## 2023-04-09 NOTE — Patient Instructions (Addendum)
 TMC 0.1% cream use at legs, elbows as needed for flares. Avoid applying to face, groin, and axilla. Use as directed. Long-term use can cause thinning of the skin.  Clobetasol cream 0.05% cream to affected areas, hands until skin feels smooth then discontinue. Avoid applying to face, groin, and axilla. Use as directed. Long-term use can cause thinning of the skin.  Tacrolimus 0.1% ointment BID. Do not pick up if too expensive.    Due to recent changes in healthcare laws, you may see results of your pathology and/or laboratory studies on MyChart before the doctors have had a chance to review them. We understand that in some cases there may be results that are confusing or concerning to you. Please understand that not all results are received at the same time and often the doctors may need to interpret multiple results in order to provide you with the best plan of care or course of treatment. Therefore, we ask that you please give [place] 2 business days to thoroughly review all your results before contacting the office for clarification. Should we see a critical lab result, you will be contacted sooner.   If You Need Anything After Your Visit  If you have any questions or concerns for your doctor, please call our main line at [phone] and press option 4 to reach your doctor's medical assistant. If no one answers, please leave a voicemail as directed and we will return your call as soon as possible. Messages left after 4 pm will be answered the following business day.   You may also send [place] a message via MyChart. We typically respond to MyChart messages within 1-2 business days.  For prescription refills, please ask your pharmacy to contact our office. Our fax number is [phone].  If you have an urgent issue when the clinic is closed that cannot wait until the next business day, you can page your doctor at the number below.    Please note that while we do our best to be available for urgent  issues outside of office hours, we are not available 24/7.   If you have an urgent issue and are unable to reach [place], you may choose to seek medical care at your doctor's office, retail clinic, urgent care center, or emergency room.  If you have a medical emergency, please immediately call 911 or go to the emergency department.  Pager Numbers  - Dr. Gwen Pounds: [phone]  - Dr. Roseanne Reno: [phone]  - Dr. Katrinka Blazing: [phone]   In the event of inclement weather, please call our main line at [phone] for an update on the status of any delays or closures.  Dermatology Medication Tips: Please keep the boxes that topical medications come in in order to help keep track of the instructions about where and how to use these. Pharmacies typically print the medication instructions only on the boxes and not directly on the medication tubes.   If your medication is too expensive, please contact our office at [phone] option 4 or send [place] a message through MyChart.   We are unable to tell what your co-pay for medications will be in advance as this is different depending on your insurance coverage. However, we may be able to find a substitute medication at lower cost or fill out paperwork to get insurance to cover a needed medication.   If a prior authorization is required to get your medication covered by your insurance company, please allow [place] 1-2 business days to complete this process.  Drug  prices often vary depending on where the prescription is filled and some pharmacies may offer cheaper prices.  The website www.goodrx.com contains coupons for medications through different pharmacies. The prices here do not account for what the cost may be with help from insurance (it may be cheaper with your insurance), but the website can give you the price if you did not use any insurance.  - You can print the associated coupon and take it with your prescription to the pharmacy.  - You may also stop  by our office during regular business hours and pick up a GoodRx coupon card.  - If you need your prescription sent electronically to a different pharmacy, notify our office through [facility] or by phone at [phone] option 4.     Si Usted Necesita Algo Despus de Su Visita  Tambin puede enviarnos un mensaje a travs de Clinical cytogeneticist. Por lo general respondemos a los mensajes de MyChart en el transcurso de 1 a 2 das hbiles.  Para renovar recetas, por favor pida a su farmacia que se ponga en contacto con nuestra oficina. Annie Sable de fax es [place] [phone].  Si tiene un asunto urgente cuando la clnica est cerrada y que no puede esperar hasta el siguiente da hbil, puede llamar/localizar a su doctor(a) al nmero que aparece a continuacin.   Por favor, tenga en cuenta que aunque hacemos todo lo posible para estar disponibles para asuntos urgentes fuera del horario de [place], no estamos disponibles las 24 horas del da, los 7 [address]  [address] de la [place].   Si tiene un problema urgente y no puede comunicarse con nosotros, puede optar por buscar atencin mdica  en el consultorio de su doctor(a), en una clnica privada, en un centro de atencin urgente o en una sala de emergencias.  Si tiene Engineer, drilling, por favor llame inmediatamente al 911 o vaya a la sala de emergencias.  Nmeros de bper  - Dr. Gwen Pounds: [phone]  - Dra. Roseanne Reno: [phone]  - Dr. Katrinka Blazing: [phone]   En caso de inclemencias del tiempo, por favor llame a Lacy Duverney principal al [phone] para una actualizacin sobre el [place] de cualquier retraso o cierre.  Consejos para la medicacin en dermatologa: Por favor, guarde las cajas en las que vienen los medicamentos de uso tpico para ayudarle a seguir las instrucciones sobre dnde y cmo usarlos. Las farmacias generalmente imprimen las instrucciones del medicamento slo en las cajas y no directamente en los tubos del [place].   Si su  medicamento es muy caro, por favor, pngase en contacto con Rolm Gala llamando al [phone] y presione la opcin 4 o envenos un mensaje a travs de Clinical cytogeneticist.   No podemos decirle cul ser su copago por los medicamentos por adelantado ya que esto es diferente dependiendo de la cobertura de su seguro. Sin embargo, es posible que podamos encontrar un medicamento sustituto a Audiological scientist un formulario para que el seguro cubra el medicamento que se considera necesario.   Si se requiere una autorizacin previa para que su compaa de seguros [place] su medicamento, por favor permtanos de 1 a 2 das hbiles para completar [address]  Los precios de los medicamentos varan con frecuencia dependiendo del Environmental consultant de dnde se surte la receta y alguna farmacias pueden ofrecer precios ms baratos.  El sitio web www.goodrx.com tiene cupones para medicamentos de Health and safety inspector. Los precios aqu no tienen en cuenta lo que podra costar con la ayuda del seguro (puede ser ms  barato con su seguro), pero el sitio web puede darle el precio si no Visual merchandiser.  - Puede imprimir el cupn correspondiente y llevarlo con su receta a la farmacia.  - Tambin puede pasar por nuestra oficina durante el horario de atencin regular y Education officer, museum una tarjeta de cupones de GoodRx.  - Si necesita que su receta se enve electrnicamente a una farmacia diferente, informe a nuestra oficina a travs de MyChart de [place] o por telfono llamando al [phone] y presione la opcin 4.

## 2023-04-09 NOTE — Progress Notes (Signed)
 New Patient Visit   Subjective  Carrie Wright is a 23 y.o. 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 Would like mole checked at L forearm center looks darker was itchy the other day and would like to have it looked at. Patient also wanting face looked at reports she has overgrowth of hair and gets ingrown hairs on top of eczema.  Patient reports hx melanoma mother and maternal grandmother.   The patient has spots, moles and lesions to be evaluated, some may be new or changing and the patient may have concern these could be cancer.  The following portions of the chart were reviewed this encounter and updated as appropriate: medications, allergies, medical history  Review of Systems:  No other skin or systemic complaints except as noted in HPI or Assessment and Plan.  Objective  Well appearing patient in no apparent distress; mood and affect are within normal limits.  A focused examination was performed of the following areas: Face, hands, arms, legs, neck  Relevant exam findings are noted in the Assessment and Plan.    Assessment & Plan   MELANOCYTIC NEVI Exam: brown pigmented papule at L forearm   Treatment Plan: Benign appearing on exam today. Recommend observation. Call clinic for new or changing moles. Recommend daily use of broad spectrum spf 30+ sunscreen to sun-exposed areas.    FAMILY HISTORY OF SKIN CANCER What type(s): Melanoma Who affected: Mother and maternal grandmother   ATOPIC DERMATITIS Exam: R posterior thigh pink scaly patch, lichenified thin plaques at bilateral ac fossa. Scaly patches with some fissuring at palms and fingers. Lichenified scaly hyperpigmented plaques of dorsal hands and fingers 5-10% BSA   Chronic and persistent condition with duration or expected duration over one year. Condition  is bothersome/symptomatic for patient. Currently flared.   Atopic dermatitis (eczema) is a chronic, relapsing, pruritic condition that can significantly affect quality of life. It is often associated with allergic rhinitis and/or asthma and can require treatment with topical medications, phototherapy, or in severe cases biologic injectable medication (Dupixent; Adbry) or Oral JAK inhibitors.  Treatment Plan: Continue using TMC 0.1% cream to aa, legs, elbows prn flares. Avoid applying to face, groin, and axilla. Use as directed. Long-term use can cause thinning of the skin.  Continue using clobetasol 0.05% cream to aa, at hands, dicontinue when skin feels smooth. Avoid applying to face, groin, and axilla. Use as directed. Long-term use can cause thinning of the skin.  Topical steroids (such as triamcinolone, fluocinolone, fluocinonide, mometasone, clobetasol, halobetasol, betamethasone, hydrocortisone) can cause thinning and lightening of the skin if they are used for too long in the same area. Your physician has selected the right strength medicine for your problem and area affected on the body. Please use your medication only as directed by your physician to prevent side effects.   Start tacrolimus 0.1% ointment BID. Patient advised to not pick up if too expensive.   Recommend gentle skin care.   Discussed Dupixent injections.    Potential side effects include allergic reaction, herpes infections, injection site reactions and conjunctivitis (inflammation of the eyes).  The use of Dupixent requires long term medication management, including periodic office visits.   Reviewed risks of biologics including immunosuppression, infections, injection site reaction, and failure to improve condition. Goal is control of skin condition, not cure.  Some older biologics  such as Humira and Enbrel may slightly increase risk of malignancy and may worsen congestive heart failure.  Taltz and Cosentyx may cause  inflammatory bowel disease to flare. The use of biologics requires long term medication management, including periodic office visits and monitoring of blood work.   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 Condition is bothersome/symptomatic for patient. Currently flared.   Treatment Plan: Apply adapalene every other day for 1 month. If not to drying, can increase to daily use. Recommend moisturizing if too drying. Sample given to patient.   Laser is best treatment for hair removal but is not covered by insurance  Topical retinoid medications like tretinoin/Retin-A, adapalene/Differin, tazarotene/Fabior, and Epiduo/Epiduo Forte can cause dryness and irritation when first started. Only apply a pea-sized amount to the entire affected area. Avoid applying it around the eyes, edges of mouth and creases at the nose. If you experience irritation, use a good moisturizer first and/or apply the medicine less often. If you are doing well with the medicine, you can increase how often you use it until you are applying every night. Be careful with sun protection while using this medication as it can make you sensitive to the sun. This medicine should not be used by pregnant women.       ATOPIC DERMATITIS, UNSPECIFIED TYPE   BENIGN NEVUS   COUNSELING AND COORDINATION OF CARE   PSEUDOFOLLICULITIS   POSTINFLAMMATORY HYPERPIGMENTATION   HIRSUTISM    Return for 1-2 months , Atopic Dermatitis, w/ Dr. Katrinka Blazing.  Wynonia Lawman, CMA, am acting as scribe for Elie Goody, MD .   Documentation: I have reviewed the above documentation for accuracy and completeness, and I agree with the above.  Elie Goody, MD

## 2023-04-14 ENCOUNTER — Encounter: Payer: Self-pay | Admitting: Nurse Practitioner

## 2023-04-14 ENCOUNTER — Other Ambulatory Visit: Payer: Self-pay | Admitting: Nurse Practitioner

## 2023-04-14 MED ORDER — AMPHETAMINE-DEXTROAMPHET ER 20 MG PO CP24: 20.0000 mg | ORAL_CAPSULE | ORAL | 0 refills | Status: DC

## 2023-04-15 NOTE — Telephone Encounter (Signed)
 Carrie Sams, DO    Receipt confirmed in Epic by pharmacy.

## 2023-05-11 ENCOUNTER — Ambulatory Visit: Admitting: Dermatology

## 2023-05-21 ENCOUNTER — Ambulatory Visit: Admitting: Dermatology

## 2023-05-21 ENCOUNTER — Encounter: Payer: Self-pay | Admitting: Dermatology

## 2023-05-21 DIAGNOSIS — L209 Atopic dermatitis, unspecified: Secondary | ICD-10-CM

## 2023-05-21 DIAGNOSIS — Z7189 Other specified counseling: Secondary | ICD-10-CM

## 2023-05-21 DIAGNOSIS — Z79899 Other long term (current) drug therapy: Secondary | ICD-10-CM

## 2023-05-21 DIAGNOSIS — L810 Postinflammatory hyperpigmentation: Secondary | ICD-10-CM | POA: Diagnosis not present

## 2023-05-21 MED ORDER — DUPILUMAB 300 MG/2ML ~~LOC~~ SOAJ
600.0000 mg | Freq: Once | SUBCUTANEOUS | Status: AC
  Administered 2023-05-21: 600 mg via SUBCUTANEOUS

## 2023-05-21 MED ORDER — DUPIXENT 300 MG/2ML ~~LOC~~ SOAJ: 300.0000 mg | SUBCUTANEOUS | 5 refills | Status: DC

## 2023-05-21 NOTE — Patient Instructions (Addendum)
 Dupilumab (Dupixent) is a treatment given by injection for adults and children with moderate-to-severe atopic dermatitis. Goal is control of skin condition, not cure. It is given as 2 injections at the first dose followed by 1 injection ever 2 weeks thereafter.  Young children are dosed monthly.  Potential side effects include allergic reaction, herpes infections, injection site reactions and conjunctivitis (inflammation of the eyes).  The use of Dupixent requires long term medication management, including periodic office visits.  [facility] Your prescription has been sent to [facility].  [facility] will call you to obtain additional  information needed to fill your prescription such as:  Additional insurance information Payment for any out-of-pocket expense (may require credit card) Address for drug delivery shipment  A timely response to the pharmacy may help you obtain your medication more quickly. Please call [facility] at [phone].   Due to recent changes in healthcare laws, you may see results of your pathology and/or laboratory studies on MyChart before the doctors have had a chance to review them. We understand that in some cases there may be results that are confusing or concerning to you. Please understand that not all results are received at the same time and often the doctors may need to interpret multiple results in order to provide you with the best plan of care or course of treatment. Therefore, we ask that you please give us  2 business days to thoroughly review all your results before contacting the office for clarification. Should we see a critical lab result, you will be contacted sooner.   If You Need Anything After Your Visit  If you have any questions or concerns for your doctor, please call our main line at [phone] and press option 4 to reach your doctor's medical assistant. If no one answers, please leave a voicemail  as directed and we will return your call as soon as possible. Messages left after 4 pm will be answered the following business day.   You may also send us  a message via MyChart. We typically respond to MyChart messages within 1-2 business days.  For prescription refills, please ask your pharmacy to contact our office. Our fax number is [phone].  If you have an urgent issue when the clinic is closed that cannot wait until the next business day, you can page your doctor at the number below.    Please note that while we do our best to be available for urgent issues outside of office hours, we are not available 24/7.   If you have an urgent issue and are unable to reach us , you may choose to seek medical care at your doctor's office, retail clinic, urgent care center, or emergency room.  If you have a medical emergency, please immediately call 911 or go to the emergency department.  Pager Numbers  - Dr. Bary Likes: [phone]  - Dr. Annette Barters: [phone]  - Dr. Felipe Horton: [phone]   In the event of inclement weather, please call our main line at [phone] for an update on the status of any delays or closures.  Dermatology Medication Tips: Please keep the boxes that topical medications come in in order to help keep track of the instructions about where and how to use these. Pharmacies typically print the medication instructions only on the boxes and not directly on the medication tubes.   If your medication is too expensive, please contact our office at [phone] option 4 or send us  a message through MyChart.  We are unable to tell what your co-pay for medications will be in advance as this is different depending on your insurance coverage. However, we may be able to find a substitute medication at lower cost or fill out paperwork to get insurance to cover a needed medication.   If a prior authorization is required to get your medication covered by your insurance company, please  allow us  1-2 business days to complete this process.  Drug prices often vary depending on where the prescription is filled and some pharmacies may offer cheaper prices.  The website www.goodrx.com contains coupons for medications through different pharmacies. The prices here do not account for what the cost may be with help from insurance (it may be cheaper with your insurance), but the website can give you the price if you did not use any insurance.  - You can print the associated coupon and take it with your prescription to the pharmacy.  - You may also stop by our office during regular business hours and pick up a GoodRx coupon card.  - If you need your prescription sent electronically to a different pharmacy, notify our office through [facility] or by phone at [phone] option 4.     Si Usted Necesita Algo Despus de Su Visita  Tambin puede enviarnos un mensaje a travs de Clinical cytogeneticist. Por lo general respondemos a los mensajes de MyChart en el transcurso de 1 a 2 das hbiles.  Para renovar recetas, por favor pida a su farmacia que se ponga en contacto con nuestra oficina. Franz Jacks de fax es [place] [phone].  Si tiene un asunto urgente cuando la clnica est cerrada y que no puede esperar hasta el siguiente da hbil, puede llamar/localizar a su doctor(a) al nmero que aparece a continuacin.   Por favor, tenga en cuenta que aunque hacemos todo lo posible para estar disponibles para asuntos urgentes fuera del horario de [place], no estamos disponibles las 24 horas del da, los 7 [address]  [address] de la [place].   Si tiene un problema urgente y no puede comunicarse con nosotros, puede optar por buscar atencin mdica  en el consultorio de su doctor(a), en una clnica privada, en un centro de atencin urgente o en una sala de emergencias.  Si tiene Engineer, drilling, por favor llame inmediatamente al 911 o vaya a la sala de emergencias.  Nmeros de bper  - Dr. Bary Likes:  [phone]  - Dra. Annette Barters: [phone]  - Dr. Felipe Horton: [phone]   En caso de inclemencias del tiempo, por favor llame a Lajuan Pila principal al [phone] para una actualizacin sobre el [place] de cualquier retraso o cierre.  Consejos para la medicacin en dermatologa: Por favor, guarde las cajas en las que vienen los medicamentos de uso tpico para ayudarle a seguir las instrucciones sobre dnde y cmo usarlos. Las farmacias generalmente imprimen las instrucciones del medicamento slo en las cajas y no directamente en los tubos del [place].   Si su medicamento es muy caro, por favor, pngase en contacto con Bettyjane Brunet llamando al [phone] y presione la opcin 4 o envenos un mensaje a travs de Clinical cytogeneticist.   No podemos decirle cul ser su copago por los medicamentos por adelantado ya que esto es diferente dependiendo de la cobertura de su seguro. Sin embargo, es posible que podamos encontrar un medicamento sustituto a Audiological scientist un formulario para que el seguro cubra el medicamento que se considera necesario.   Si se requiere una autorizacin previa para que  su compaa de seguros [place] su medicamento, por favor permtanos de 1 a 2 das hbiles para completar [address]  Los precios de los medicamentos varan con frecuencia dependiendo del Environmental consultant de dnde se surte la receta y alguna farmacias pueden ofrecer precios ms baratos.  El sitio web www.goodrx.com tiene cupones para medicamentos de Health and safety inspector. Los precios aqu no tienen en cuenta lo que podra costar con la ayuda del seguro (puede ser ms barato con su seguro), pero el sitio web puede darle el precio si no utiliz Tourist information centre manager.  - Puede imprimir el cupn correspondiente y llevarlo con su receta a la farmacia.  - Tambin puede pasar por nuestra oficina durante el horario de atencin regular y Education officer, museum una tarjeta de cupones de GoodRx.  - Si necesita que su receta se enve electrnicamente a  una farmacia diferente, informe a nuestra oficina a travs de MyChart de [place] o por telfono llamando al [phone] y presione la opcin 4.

## 2023-05-21 NOTE — Progress Notes (Signed)
   Follow-Up Visit   Subjective  Carrie Wright is a 23 y.o. female who presents for the following: atopic dermatitis at hands, arms, legs and chest. Patient has been using TMC 0.1% cr and clobetasol  cr and they seem to be helping. Patient was not able to get tacrolimus  due to cost.  The patient has spots, moles and lesions to be evaluated, some may be new or changing and the patient may have concern these could be cancer.   The following portions of the chart were reviewed this encounter and updated as appropriate: medications, allergies, medical history  Review of Systems:  No other skin or systemic complaints except as noted in HPI or Assessment and Plan.  Objective  Well appearing patient in no apparent distress; mood and affect are within normal limits.   A focused examination was performed of the following areas: Legs, arms, chest  Relevant exam findings are noted in the Assessment and Plan.    Assessment & Plan   ATOPIC DERMATITIS, failed clobetasol  and tacrolimus  Exam: Scaly hyperpigmented plaques on extremities. Lichenified hyperpigmented plaques on dorsal hands and wrists. Scattered PIH 5-10% BSA  flared  Atopic dermatitis (eczema) is a chronic, relapsing, pruritic condition that can significantly affect quality of life. It is often associated with allergic rhinitis and/or asthma and can require treatment with topical medications, phototherapy, or in severe cases biologic injectable medication (Dupixent; Adbry) or Oral JAK inhibitors.  Treatment Plan: Start loading dose of Dupixent 600 mg/ 4mL today using samples.  Dupixent 300 mg/2 mL injected to patient's right upper arm. Patient tolerated well. Patient self-injected Dupixent 300 mg/2 mL to left upper thigh. Pt tolerated well.  Lot # [id number]  Exp: 01/12/2025  Continue Dupixent 300 mg/2 mL sq q2 weeks. Patient given samples x 2 Lot # [id number]   Exp: 01/12/2025 Maintenance dosing sent to [facility].  Recommend gentle  skin care.  Dupilumab (Dupixent) is a treatment given by injection for adults and children with moderate-to-severe atopic dermatitis. Goal is control of skin condition, not cure. It is given as 2 injections at the first dose followed by 1 injection ever 2 weeks thereafter.  Young children are dosed monthly.  Potential side effects include allergic reaction, arthritis, injection site reactions and conjunctivitis (inflammation of the eyes).  The use of Dupixent requires long term medication management, including periodic office visits. ATOPIC DERMATITIS, UNSPECIFIED TYPE   Related Medications Dupilumab (DUPIXENT) 300 MG/2ML SOAJ Inject 300 mg into the skin every 14 (fourteen) days. Starting at day 15 for maintenance. Dupilumab SOAJ 600 mg  COUNSELING AND COORDINATION OF CARE   LONG-TERM USE OF HIGH-RISK MEDICATION   Related Medications Dupilumab SOAJ 600 mg  POSTINFLAMMATORY HYPERPIGMENTATION    Return in about 3 months (around 08/21/2023) for Atopic Dermatitis.  Kerstin Peeling, RMA, am acting as scribe for Harris Liming, MD .   Documentation: I have reviewed the above documentation for accuracy and completeness, and I agree with the above.  Harris Liming, MD

## 2023-05-26 ENCOUNTER — Telehealth: Payer: BC Managed Care – PPO | Admitting: Nurse Practitioner

## 2023-05-26 ENCOUNTER — Encounter: Payer: Self-pay | Admitting: Nurse Practitioner

## 2023-05-26 DIAGNOSIS — F909 Attention-deficit hyperactivity disorder, unspecified type: Secondary | ICD-10-CM | POA: Diagnosis not present

## 2023-05-26 DIAGNOSIS — F3130 Bipolar disorder, current episode depressed, mild or moderate severity, unspecified: Secondary | ICD-10-CM

## 2023-05-26 MED ORDER — LAMOTRIGINE ER 50 MG PO TB24: 50.0000 mg | ORAL_TABLET | Freq: Every day | ORAL | 0 refills | Status: DC

## 2023-05-26 MED ORDER — AMPHETAMINE-DEXTROAMPHET ER 20 MG PO CP24: 20.0000 mg | ORAL_CAPSULE | ORAL | 0 refills | Status: DC

## 2023-05-26 MED ORDER — LAMOTRIGINE ER 100 MG PO TB24: 100.0000 mg | ORAL_TABLET | Freq: Every day | ORAL | 0 refills | Status: DC

## 2023-05-26 NOTE — Progress Notes (Signed)
 Virtual Visit via Video Note  I connected with Carrie Wright on 05/26/23 at 10:40 AM EDT by a video enabled telemedicine application and verified that I am speaking with the correct person using two identifiers.  Location: Patient: home Provider: home   I discussed the limitations of evaluation and management by telemedicine and the availability of in person appointments. The patient expressed understanding and agreed to proceed.  History of Present Illness: Presents for routine follow-up on ADHD and bipolar disorder.  States she is feeling "pretty good".  Will be going back to school by the end of May.  States the Adderall helps her feel "levelheaded".  Has been able to complete task and focus much better.  Denies any adverse effects including headache palpitations or trouble sleeping.  Continues to get regular mental health therapy.  Also doing well on current dose of Lamictal .  Bipolar symptoms are stable.  Denies any suicidal or homicidal thoughts or ideation.  Denies any self-harm behavior.   Observations/Objective: NAD.  Alert, oriented.  Speech clear.  Calm cheerful affect.  Thoughts logical coherent and relevant.  Normal judgment and behavior.  Assessment and Plan: Problem List Items Addressed This Visit       Other   Adult ADHD - Primary   Bipolar I disorder, most recent episode depressed (HCC)   Meds ordered this encounter  Medications   amphetamine -dextroamphetamine (ADDERALL XR) 20 MG 24 hr capsule    Sig: Take 1 capsule (20 mg total) by mouth every morning.    Dispense:  30 capsule    Refill:  0    May fill 60 days from 05/26/23    Supervising Provider:   Charlotta Cook A [[id number]]   amphetamine -dextroamphetamine (ADDERALL XR) 20 MG 24 hr capsule    Sig: Take 1 capsule (20 mg total) by mouth every morning.    Dispense:  30 capsule    Refill:  0    May fill 30 days from 05/26/23    Supervising Provider:   Charlotta Cook A [[id number]]   lamoTRIgine  (LAMICTAL  XR) 100 MG 24 hour  tablet    Sig: Take 1 tablet (100 mg total) by mouth at bedtime.    Dispense:  90 tablet    Refill:  0    Supervising Provider:   Charlotta Cook A [[id number]]   lamoTRIgine  (LAMICTAL  XR) 50 MG 24 hour tablet    Sig: Take 1 tablet (50 mg total) by mouth daily.    Dispense:  90 tablet    Refill:  0    Supervising Provider:   Charlotta Cook A [[id number]]   amphetamine -dextroamphetamine (ADDERALL XR) 20 MG 24 hr capsule    Sig: Take 1 capsule (20 mg total) by mouth every morning.    Dispense:  30 capsule    Refill:  0    Supervising Provider:   Charlotta Cook A [[id number]]     Follow Up Instructions: Continue current medication regimen as directed. Return in about 3 months (around 08/26/2023). Call back in the meantime if any problems.   I discussed the assessment and treatment plan with the patient. The patient was provided an opportunity to ask questions and all were answered. The patient agreed with the plan and demonstrated an understanding of the instructions.   The patient was advised to call back or seek an in-person evaluation if the symptoms worsen or if the condition fails to improve as anticipated.  I provided 15 minutes of non-face-to-face time during this encounter.   Carrie Wright  Kae Oram, NP

## 2023-05-27 ENCOUNTER — Encounter: Payer: Self-pay | Admitting: Nurse Practitioner

## 2023-06-01 ENCOUNTER — Encounter: Payer: Self-pay | Admitting: Nurse Practitioner

## 2023-06-06 ENCOUNTER — Encounter: Payer: Self-pay | Admitting: Dermatology

## 2023-07-01 ENCOUNTER — Encounter: Payer: Self-pay | Admitting: Nurse Practitioner

## 2023-07-09 DIAGNOSIS — Z708 Other sex counseling: Secondary | ICD-10-CM | POA: Diagnosis not present

## 2023-07-16 NOTE — Telephone Encounter (Signed)
Left message for patient to return my call. aw 

## 2023-07-20 ENCOUNTER — Telehealth: Payer: Self-pay

## 2023-07-20 ENCOUNTER — Other Ambulatory Visit: Payer: Self-pay

## 2023-07-20 MED ORDER — LAMOTRIGINE ER 50 MG PO TB24: 50.0000 mg | ORAL_TABLET | Freq: Every day | ORAL | 0 refills | Status: DC

## 2023-07-20 NOTE — Telephone Encounter (Signed)
 Prescription Request  07/20/2023  LOV: Visit date not found     What is the name of the medication or equipment? amoTRIgine (LAMICTAL  XR) 50 MG 24 hour tablet   Have you contacted your pharmacy to request a refill? Yes   Which pharmacy would you like this sent to?    [facility]   Patient notified that their request is being sent to the clinical staff for review and that they should receive a response within 2 business days.   Please advise at Mobile [phone] (mobile)

## 2023-07-27 ENCOUNTER — Encounter: Payer: Self-pay | Admitting: Dermatology

## 2023-07-28 ENCOUNTER — Other Ambulatory Visit: Payer: Self-pay | Admitting: Dermatology

## 2023-08-07 ENCOUNTER — Ambulatory Visit: Payer: BC Managed Care – PPO | Admitting: Neurology

## 2023-08-07 NOTE — Progress Notes (Deleted)
 NEUROLOGY FOLLOW UP OFFICE NOTE  Carrie Wright [id number]  Assessment/Plan:   Tension type headaches Idiopathic intracranial hypertension    *** Coffee cessation, increase water intake, continue exercise.  She cannot tolerate muscle relaxants.  Caution starting antidepressant as to not interfere management of her Bipolar disorder Follow up 6 months.     Subjective:  Carrie Wright is a 23 year old right-handed female with asthma, PCOS, DM II, Bipolar 2 disorder and migraines who follows up for idiopathic intracranial hypertension and migraines.   UPDATE: Current medications:  lamotrigine  XR 150mg  daily  At last visit in December, she was advised to get a repeat eye exam at [facility] due to recurrence of headache.  ***     HISTORY:  Symptoms started in 2020.  She reports what sounds like blood rushing in her head.  She also reports fuzzy vision in her periphery and sometimes will have static that blocks her entire vision.  She has longstanding history of migraines since childhood in band-like distribution with visual aura (flashes of color), nausea, photophobia.  They are infrequent. However, she developed new headaches which are right sided or left sided without associated symptoms, worse when standing or moving around.  She also notes pain and numbness down the right arm to the index finger.  Neck pain.  She saw the eye doctor in April 2023 and was found to have bilateral papilledema.  She did report a 20 lb weight gain over the previous year.  MRI of brain with and without contrast on 04/30/2021 was normal.  MRV of head on 05/16/2021 personally reviewed showed hypoplastic but patent left transverse sinus and filling defect in the right jugular bulb representing artifact or thrombus.  Unable to obtain IV access to confirm.  She had a follow up CTV of head on 05/30/2021 which did not reveal thrombosis but did demonstrate new left maxillary sinus mucosal thickening.  She didn't  follow up with her PCP but she is feeling well.  She underwent LP on 05/22/2021 which demonstrate elevated opening pressure of 42 cm of water.  No birth control.         Past NSAIDS/analgesics:  naproxen  Past abortive triptans:  eletriptan , sumatriptan  tab, rizatriptan  Past abortive ergotamine:  none Past muscle relaxants:  tizanidine  Past anti-emetic:  Zofran , Phenergan  Past antihypertensive medications:  none Past antidepressant/antipsychotic medications:  Wellbutrin, Vraylar  Past anticonvulsant medications:  Depakote , topiramate  Past anti-CGRP:  none Past antihistamines/decongestants:  Benadryl   PAST MEDICAL HISTORY: Past Medical History:  Diagnosis Date   Allergy    Asthma    Bipolar 2 disorder (HCC)    Migraine    Migraine    Suicidal behavior with attempted self-injury [facility])     MEDICATIONS: Current Outpatient Medications on File Prior to Visit  Medication Sig Dispense Refill   acetaminophen  (TYLENOL ) 325 MG tablet Take by mouth.     albuterol  (VENTOLIN  HFA) 108 (90 Base) MCG/ACT inhaler Inhale 2 puffs into the lungs every 6 (six) hours as needed for wheezing or shortness of breath (coughing). 18 g 3   amphetamine -dextroamphetamine (ADDERALL XR) 20 MG 24 hr capsule Take 1 capsule (20 mg total) by mouth every morning. 30 capsule 0   amphetamine -dextroamphetamine (ADDERALL XR) 20 MG 24 hr capsule Take 1 capsule (20 mg total) by mouth every morning. 30 capsule 0   amphetamine -dextroamphetamine (ADDERALL XR) 20 MG 24 hr capsule Take 1 capsule (20 mg total) by mouth every morning. 30 capsule 0   budesonide -formoterol  (SYMBICORT )  160-4.5 MCG/ACT inhaler Inhale 2 puffs into the lungs 2 (two) times daily. 1 each 3   clobetasol  cream (TEMOVATE ) 0.05 % Apply 1 Application topically 2 (two) times daily. 30 g 0   Dupilumab  (DUPIXENT ) 300 MG/2ML SOAJ Inject 300 mg into the skin every 14 (fourteen) days. Starting at day 15 for maintenance. 4 mL 5   lamoTRIgine  (LAMICTAL  XR) 100 MG 24 hour  tablet Take 1 tablet (100 mg total) by mouth at bedtime. 90 tablet 0   lamoTRIgine  (LAMICTAL  XR) 50 MG 24 hour tablet Take 1 tablet (50 mg total) by mouth daily. 90 tablet 0   tacrolimus  (PROTOPIC ) 0.1 % ointment Apply topically 2 (two) times daily. 30 g 0   No current facility-administered medications on file prior to visit.    ALLERGIES: Allergies  Allergen Reactions   Gardasil [Human Papillomavirus 4-Valent Recombinant Vaccine] Rash    FAMILY HISTORY: Family History  Problem Relation Age of Onset   Cancer Mother    Migraines Mother    Depression Mother    Melanoma Mother    Migraines Father    Migraines Maternal Grandmother    Schizophrenia Maternal Grandfather    Breast cancer Paternal Grandmother    Colon cancer Maternal Great-grandmother       Objective:  *** General: No acute distress.  Patient appears well-groomed.   ***   Juliene Dunnings, DO  CC: Jacqulyn Ahle, DO

## 2023-08-10 ENCOUNTER — Ambulatory Visit: Payer: BC Managed Care – PPO | Admitting: Neurology

## 2023-08-22 ENCOUNTER — Encounter: Payer: Self-pay | Admitting: Nurse Practitioner

## 2023-08-24 ENCOUNTER — Encounter: Payer: Self-pay | Admitting: Dermatology

## 2023-08-24 ENCOUNTER — Ambulatory Visit: Admitting: Dermatology

## 2023-08-24 DIAGNOSIS — L209 Atopic dermatitis, unspecified: Secondary | ICD-10-CM

## 2023-08-24 DIAGNOSIS — Z79899 Other long term (current) drug therapy: Secondary | ICD-10-CM

## 2023-08-24 DIAGNOSIS — Z7189 Other specified counseling: Secondary | ICD-10-CM

## 2023-08-24 MED ORDER — PIMECROLIMUS 1 % EX CREA: TOPICAL_CREAM | CUTANEOUS | 5 refills | Status: DC

## 2023-08-24 NOTE — Patient Instructions (Signed)

## 2023-08-24 NOTE — Progress Notes (Signed)
   Follow-Up Visit   Subjective  Carrie Wright is a 23 y.o. female who presents for the following: Atopic derm f/u patient on Dupixent  injections and pleased with injections, patient states she had developed rash at nose, over cheeks since starting. Feels like skin is thinner, more sensitive, and dry. Patient is due for injection this week. Patient states she notices flares when she is coming due for next shot. Patient afraid to switch to another biologic since she likes how she has done on Dupixent . Patient has been on samples.    The following portions of the chart were reviewed this encounter and updated as appropriate: medications, allergies, medical history  Review of Systems:  No other skin or systemic complaints except as noted in HPI or Assessment and Plan.  Objective  Well appearing patient in no apparent distress; mood and affect are within normal limits.  A focused examination was performed of the following areas: Arms, hands, legs, face  Relevant exam findings are noted in the Assessment and Plan.    Assessment & Plan   ATOPIC DERMATITIS, failed clobetasol  and tacrolimus   Exam: Scaly pink papules within pink patches on face. Thin scaly lichenified plaques dorsal hands wrists 5% BSA  Chronic and persistent condition with duration or expected duration over one year. Condition is bothersome/symptomatic for patient. Currently flared.   Atopic dermatitis (eczema) is a chronic, relapsing, pruritic condition that can significantly affect quality of life. It is often associated with allergic rhinitis and/or asthma and can require treatment with topical medications, phototherapy, or in severe cases biologic injectable medication (Dupixent ; Adbry) or Oral JAK inhibitors.  Treatment Plan: Discussed switching Dupixent  to Ebglyss, reviewed less side effects. Patient hesitant to switch now. Discussed trying to add a topical non-steroid and if not resolving rash at face, patient will  consider switching from Dupixent  to an alternative like Ebglyss.   Patient will have new insurance as of September, patient will reach out via MyChart and advise us  once her new insurance starts to start a new prior authorization with new coverage.   Continue Dupixent  injections 300 mg/2mL SQ 2 weeks. Patient given pens samples x2 boxes. NDC: 9975-4084-79 Lot: [id number] Exp: 01/12/2025  NDC: 9975-4084-79 Lot: [id number] Exp: 07/15/2025  Dupilumab  (Dupixent ) is a treatment given by injection for adults and children with moderate-to-severe atopic dermatitis. Goal is control of skin condition, not cure. It is given as 2 injections at the first dose followed by 1 injection ever 2 weeks thereafter.  Young children are dosed monthly.  Potential side effects include allergic reaction, herpes infections, injection site reactions and conjunctivitis (inflammation of the eyes).  The use of Dupixent  requires long term medication management, including periodic office visits.    Recommend gentle skin care.     Return in about 1 year (around 08/23/2024) for Atopic Dermatitis, w/ Dr. Claudene.  I, Jacquelynn V. Wilfred, CMA, am acting as scribe for Boneta Claudene, MD .   Documentation: I have reviewed the above documentation for accuracy and completeness, and I agree with the above.  Boneta Claudene, MD

## 2023-09-01 ENCOUNTER — Other Ambulatory Visit: Payer: Self-pay | Admitting: Nurse Practitioner

## 2023-09-01 ENCOUNTER — Telehealth: Payer: Self-pay | Admitting: Family Medicine

## 2023-09-01 MED ORDER — AMPHETAMINE-DEXTROAMPHET ER 20 MG PO CP24: 20.0000 mg | ORAL_CAPSULE | ORAL | 0 refills | Status: DC

## 2023-09-01 NOTE — Telephone Encounter (Signed)
 Already refilled today in a separate encounter.    Copied from CRM [id number]. Topic: Clinical - Medication Refill >> Sep 01, 2023  9:35 AM Carrie Wright wrote: Medication: amphetamine -dextroamphetamine  (ADDERALL XR) 20 MG 24 hr capsule  Patient is needing to transfer script to [facility] , [facility] is out  Has the patient contacted their pharmacy? Yes (Agent: If no, request that the patient contact the pharmacy for the refill. If patient does not wish to contact the pharmacy document the reason why and proceed with request.) (Agent: If yes, when and what did the pharmacy advise?)  This is the patient's preferred pharmacy:   [facility] - [place], [place] - [address] [place] [address] Phone: [phone] Fax: [phone] Hours: Not open 24 hours    Is this the correct pharmacy for this prescription? Yes If no, delete pharmacy and type the correct one.   Has the prescription been filled recently? Yes  Is the patient out of the medication? Yes  Has the patient been seen for an appointment in the last year OR does the patient have an upcoming appointment? Yes  Can we respond through MyChart? Yes  Agent: Please be advised that Rx refills may take up to 3 business days. We ask that you follow-up with your pharmacy.

## 2023-09-01 NOTE — Telephone Encounter (Unsigned)
 Copied from CRM [id number]. Topic: Clinical - Medication Refill >> Sep 01, 2023  9:35 AM Roselie BROCKS wrote: Medication: amphetamine -dextroamphetamine  (ADDERALL XR) 20 MG 24 hr capsule  Patient is needing to transfer script to [facility] , [facility] is out  Has the patient contacted their pharmacy? Yes (Agent: If no, request that the patient contact the pharmacy for the refill. If patient does not wish to contact the pharmacy document the reason why and proceed with request.) (Agent: If yes, when and what did the pharmacy advise?)  This is the patient's preferred pharmacy:   [facility] - [place], [place] - [address] [place] [address] Phone: [phone] Fax: [phone] Hours: Not open 24 hours    Is this the correct pharmacy for this prescription? Yes If no, delete pharmacy and type the correct one.   Has the prescription been filled recently? Yes  Is the patient out of the medication? Yes  Has the patient been seen for an appointment in the last year OR does the patient have an upcoming appointment? Yes  Can we respond through MyChart? Yes  Agent: Please be advised that Rx refills may take up to 3 business days. We ask that you follow-up with your pharmacy.

## 2023-09-02 ENCOUNTER — Other Ambulatory Visit: Payer: Self-pay | Admitting: Nurse Practitioner

## 2023-09-02 MED ORDER — AMPHETAMINE-DEXTROAMPHETAMINE 10 MG PO TABS: ORAL_TABLET | ORAL | 0 refills | Status: AC

## 2023-09-08 DIAGNOSIS — Z111 Encounter for screening for respiratory tuberculosis: Secondary | ICD-10-CM | POA: Diagnosis not present

## 2023-09-21 ENCOUNTER — Other Ambulatory Visit: Payer: Self-pay | Admitting: Nurse Practitioner

## 2023-09-23 DIAGNOSIS — Z23 Encounter for immunization: Secondary | ICD-10-CM | POA: Diagnosis not present

## 2023-09-23 DIAGNOSIS — F649 Gender identity disorder, unspecified: Secondary | ICD-10-CM | POA: Diagnosis not present

## 2023-09-28 ENCOUNTER — Encounter: Payer: Self-pay | Admitting: Nurse Practitioner

## 2023-09-28 ENCOUNTER — Telehealth: Admitting: Nurse Practitioner

## 2023-09-28 DIAGNOSIS — F909 Attention-deficit hyperactivity disorder, unspecified type: Secondary | ICD-10-CM

## 2023-09-28 MED ORDER — AMPHETAMINE-DEXTROAMPHET ER 20 MG PO CP24: 20.0000 mg | ORAL_CAPSULE | ORAL | 0 refills | Status: AC

## 2023-09-28 NOTE — Progress Notes (Signed)
 Virtual Visit via Video Note  I connected with Carrie Wright on 09/28/23 at  2:10 PM EDT by a video enabled telemedicine application and verified that I am speaking with the correct person using two identifiers.  Location: Patient: home Provider: office   I discussed the limitations of evaluation and management by telemedicine and the availability of in person appointments. The patient expressed understanding and agreed to proceed.  History of Present Illness: Presents for recheck on her adult ADHD. Current dose and medication working well. Doing well in college. Her sleep cycle is going to sleep around 3 am, wake up around 9 am then sleep off/on until about noon. Does not have class during the day this semester. Only at 5:30 pm. 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 Does have some hydroxyzine at home which may help sleep. Could not take Trazodone in the past due to grogginess the next day.    Observations/Objective: NAD. Alert, oriented. Calm affect. Making good eye contact. Speech clear.   Assessment and Plan: 1. Adult ADHD (Primary) Meds ordered this encounter  Medications   amphetamine -dextroamphetamine  (ADDERALL XR) 20 MG 24 hr capsule    Sig: Take 1 capsule (20 mg total) by mouth every morning.    Dispense:  30 capsule    Refill:  0    May fill 60 days from 09/08/23    Supervising Provider:   ALPHONSA HAMILTON A [[id number]]   amphetamine -dextroamphetamine  (ADDERALL XR) 20 MG 24 hr capsule    Sig: Take 1 capsule (20 mg total) by mouth every morning.    Dispense:  30 capsule    Refill:  0    May fill 30 days from 09/08/23    Supervising Provider:   ALPHONSA HAMILTON A [[id number]]   amphetamine -dextroamphetamine  (ADDERALL XR) 20 MG 24 hr capsule    Sig: Take 1 capsule (20 mg total) by mouth every morning.    Dispense:  30 capsule    Refill:  0    May fill 90 days from 09/08/23    Supervising Provider:   ALPHONSA HAMILTON A [[id number]]       Follow Up Instructions: Continue current dose of Adderall.  Try taking Hydroxyzine for sleep. Contact office if she needs refill. Discussed sleep hygiene. Current cycle works for her at this time.  Return in about 3 months (around 12/28/2023).    I discussed the assessment and treatment plan with the patient. The patient was provided an opportunity to ask questions and all were answered. The patient agreed with the plan and demonstrated an understanding of the instructions.   The patient was advised to call back or seek an in-person evaluation if the symptoms worsen or if the condition fails to improve as anticipated.  I provided 15 minutes of non-face-to-face time during this encounter.   Elveria JAYSON Quarry, NP

## 2023-10-26 ENCOUNTER — Telehealth: Payer: Self-pay | Admitting: Nurse Practitioner

## 2023-10-26 ENCOUNTER — Other Ambulatory Visit: Payer: Self-pay | Admitting: Nurse Practitioner

## 2023-10-26 MED ORDER — LAMOTRIGINE ER 50 MG PO TB24: 50.0000 mg | ORAL_TABLET | Freq: Every day | ORAL | 0 refills | Status: DC

## 2023-10-26 NOTE — Telephone Encounter (Signed)
 Refill  lamoTRIgine  (LAMICTAL  XR) 50 MG 24 hour tablet   [facility] [place]

## 2023-11-02 ENCOUNTER — Telehealth: Payer: Self-pay | Admitting: Family Medicine

## 2023-11-02 MED ORDER — LAMOTRIGINE ER 50 MG PO TB24: 50.0000 mg | ORAL_TABLET | Freq: Every day | ORAL | 0 refills | Status: AC

## 2023-11-02 NOTE — Telephone Encounter (Signed)
 Refill on  lamoTRIgine  (LAMICTAL  XR) 50 MG 24 hour tablet   [facility]

## 2023-11-04 ENCOUNTER — Other Ambulatory Visit: Payer: Self-pay | Admitting: Family Medicine

## 2023-11-04 MED ORDER — LAMOTRIGINE ER 100 MG PO TB24: 100.0000 mg | ORAL_TABLET | Freq: Every day | ORAL | 0 refills | Status: AC

## 2023-11-04 NOTE — Telephone Encounter (Signed)
 Copied from CRM [id number]. Topic: Clinical - Medication Refill >> Nov 04, 2023 11:49 AM Tiffini S wrote: Medication: lamoTRIgine  (LAMICTAL  XR) 100 MG 24 hour tablet  Has the patient contacted their pharmacy? Yes (Agent: If no, request that the patient contact the pharmacy for the refill. If patient does not wish to contact the pharmacy document the reason why and proceed with request.) (Agent: If yes, when and what did the pharmacy advise?)  This is the patient's preferred pharmacy:  [id number] [place], [place] - [address] AT [facility] [address] [place] [address] Phone: [phone] Fax: [phone]   Is this the correct pharmacy for this prescription? Yes If no, delete pharmacy and type the correct one.   Has the prescription been filled recently? Yes  Is the patient out of the medication? Yes  Has the patient been seen for an appointment in the last year OR does the patient have an upcoming appointment? Yes  Can we respond through MyChart? Yes  Agent: Please be advised that Rx refills may take up to 3 business days. We ask that you follow-up with your pharmacy.

## 2023-12-31 ENCOUNTER — Ambulatory Visit: Admitting: Nurse Practitioner

## 2024-01-04 ENCOUNTER — Encounter: Payer: Self-pay | Admitting: Family Medicine

## 2024-01-04 ENCOUNTER — Ambulatory Visit: Admitting: Family Medicine

## 2024-01-04 VITALS — BP 138/79 | HR 62 | Temp 97.7°F | Ht 65.0 in | Wt 210.6 lb

## 2024-01-04 DIAGNOSIS — E11A Type 2 diabetes mellitus without complications in remission: Secondary | ICD-10-CM

## 2024-01-04 DIAGNOSIS — Z130 Encounter for screening for diseases of the blood and blood-forming organs and certain disorders involving the immune mechanism: Secondary | ICD-10-CM | POA: Diagnosis not present

## 2024-01-04 DIAGNOSIS — E119 Type 2 diabetes mellitus without complications: Secondary | ICD-10-CM

## 2024-01-04 DIAGNOSIS — R030 Elevated blood-pressure reading, without diagnosis of hypertension: Secondary | ICD-10-CM | POA: Diagnosis not present

## 2024-01-04 NOTE — Patient Instructions (Signed)
 Monitor BP at home.  Labs today.  Follow up in 2-4 weeks.

## 2024-01-05 DIAGNOSIS — R030 Elevated blood-pressure reading, without diagnosis of hypertension: Secondary | ICD-10-CM | POA: Insufficient documentation

## 2024-01-05 DIAGNOSIS — E11A Type 2 diabetes mellitus without complications in remission: Secondary | ICD-10-CM | POA: Insufficient documentation

## 2024-01-05 NOTE — Assessment & Plan Note (Signed)
 Patient is quite young to have hypertension.  Concern for possible secondary causes.  Advised that weight, smoking, and stimulants could be playing a role.  Obtaining labs today.  Continue to monitor at home.  Goal less than 130/80.

## 2024-01-05 NOTE — Assessment & Plan Note (Signed)
 A1c in 2019 was 7.2.  Last available A1c 6.0.  Needs reassessment today.

## 2024-01-05 NOTE — Progress Notes (Signed)
 "  Subjective:  Patient ID: Carrie Wright, female    DOB: 2000/06/23  Age: 23 y.o. MRN: [id number]  CC:   Chief Complaint  Patient presents with   Follow-up    3 month f/u concerns about b/p      HPI:  23 year old female presents for follow-up.  BP is on the high side here today.  Patient is concerned about this.  Patient is on home stimulant therapy for ADHD but states that she does not take this medication but a few times a week.  She has a family history of hypertension.  She is a current smoker.  She is very worried about her blood pressure.  Patient Active Problem List   Diagnosis Date Noted   Type 2 diabetes mellitus in remission 01/05/2024   Elevated BP without diagnosis of hypertension 01/05/2024   Adult ADHD 10/29/2022   History of anaphylaxis 04/30/2021   Asthma 04/30/2021   Atopic dermatitis 04/30/2021   Papilledema 04/30/2021   PCOS (polycystic ovarian syndrome) 07/10/2015   Bipolar I disorder, most recent episode depressed (HCC) 05/13/2015   Migraine without aura 02/25/2012    Social Hx   Social History   Socioeconomic History   Marital status: Single    Spouse name: Not on file   Number of children: 0   Years of education: Not on file   Highest education level: Not on file  Occupational History   Not on file  Tobacco Use   Smoking status: Every Day    Current packs/day: 0.25    Average packs/day: 0.3 packs/day for 3.0 years (0.8 ttl pk-yrs)    Types: Cigarettes    Passive exposure: Current   Smokeless tobacco: Never  Vaping Use   Vaping status: Never Used  Substance and Sexual Activity   Alcohol use: Yes    Alcohol/week: 5.0 standard drinks of alcohol    Types: 5 Standard drinks or equivalent per week    Comment: occasionally 2 times a month   Drug use: Yes    Types: Marijuana    Comment: 3.5 grams per week (3 times per week)   Sexual activity: Yes    Birth control/protection: None    Comment: has had same sex partner  Other Topics Concern    Not on file  Social History Narrative   Lives at home with mother, father, no siblings.  Has 2 cats and 1 dog inside the house.      Right Handed    Drinks caffeine    Social Drivers of Health   Tobacco Use: High Risk (01/04/2024)   Patient History    Smoking Tobacco Use: Every Day    Smokeless Tobacco Use: Never    Passive Exposure: Current  Financial Resource Strain: Not on file  Food Insecurity: Not on file  Transportation Needs: Not on file  Physical Activity: Not on file  Stress: Not on file  Social Connections: Not on file  Depression (PHQ2-9): High Risk (01/04/2024)   Depression (PHQ2-9)    PHQ-2 Score: 16  Alcohol Screen: Not on file  Housing: Not on file  Utilities: Not on file  Health Literacy: Not on file    Review of Systems Per HPI  Objective:  BP 138/79   Pulse 62   Temp 97.7 F (36.5 C)   Ht 5' 5 (1.651 m)   Wt 210 lb 9.6 oz (95.5 kg)   LMP 12/31/2023 (Exact Date)   SpO2 100%   BMI 35.05 kg/m  01/04/2024    2:39 PM 02/26/2023   10:59 AM 01/12/2023    9:30 AM  BP/Weight  Systolic BP 138 144 128  Diastolic BP 79 85 80  Wt. (Lbs) 210.6 203   BMI 35.05 kg/m2 33.78 kg/m2     Physical Exam Vitals and nursing note reviewed.  Constitutional:      General: She is not in acute distress.    Appearance: Normal appearance.  HENT:     Head: Normocephalic and atraumatic.  Eyes:     General:        Right eye: No discharge.        Left eye: No discharge.     Conjunctiva/sclera: Conjunctivae normal.  Cardiovascular:     Rate and Rhythm: Normal rate and regular rhythm.  Pulmonary:     Effort: Pulmonary effort is normal.     Breath sounds: Normal breath sounds. No wheezing, rhonchi or rales.  Neurological:     Mental Status: She is alert.  Psychiatric:        Mood and Affect: Mood normal.        Behavior: Behavior normal.     Lab Results  Component Value Date   WBC 8.7 01/04/2024   HGB 13.7 01/04/2024   HCT 43.2 01/04/2024   PLT  364 01/04/2024   GLUCOSE 83 01/04/2024   CHOL 125 01/04/2024   TRIG 79 01/04/2024   HDL 36 (L) 01/04/2024   LDLCALC 73 01/04/2024   ALT 20 01/04/2024   AST 18 01/04/2024   NA 141 01/04/2024   K 4.6 01/04/2024   CL 103 01/04/2024   CREATININE 0.88 01/04/2024   BUN 12 01/04/2024   CO2 23 01/04/2024   TSH 0.76 02/13/2022   INR 1.24 05/13/2015   HGBA1C 5.9 (H) 01/04/2024     Assessment & Plan:  Elevated BP without diagnosis of hypertension Assessment & Plan: Patient is quite young to have hypertension.  Concern for possible secondary causes.  Advised that weight, smoking, and stimulants could be playing a role.  Obtaining labs today.  Continue to monitor at home.  Goal less than 130/80.  Orders: -     CMP14+EGFR -     Aldosterone + renin activity w/ ratio  Screening for deficiency anemia -     CBC  Type 2 diabetes mellitus in remission Assessment & Plan: A1c in 2019 was 7.2.  Last available A1c 6.0.  Needs reassessment today.  Orders: -     CMP14+EGFR -     Hemoglobin A1c -     Lipid panel -     Microalbumin / creatinine urine ratio    Follow-up:  2-4 weeks  Lazar Tierce Bluford DO [facility] "

## 2024-01-08 LAB — ALDOSTERONE + RENIN ACTIVITY W/ RATIO
Aldos/Renin Ratio: 21.6 (ref 0.0–30.0)
Aldosterone: 12.5 ng/dL (ref 0.0–30.0)
Renin Activity, Plasma: 0.58 ng/mL/h (ref 0.167–5.380)

## 2024-01-08 LAB — LIPID PANEL
Chol/HDL Ratio: 3.5 ratio (ref 0.0–4.4)
Cholesterol, Total: 125 mg/dL (ref 100–199)
HDL: 36 mg/dL — ABNORMAL LOW
LDL Chol Calc (NIH): 73 mg/dL (ref 0–99)
Triglycerides: 79 mg/dL (ref 0–149)
VLDL Cholesterol Cal: 16 mg/dL (ref 5–40)

## 2024-01-08 LAB — MICROALBUMIN / CREATININE URINE RATIO
Creatinine, Urine: 93.1 mg/dL
Microalb/Creat Ratio: <3 mg/g{creat} (ref 0–29)
Microalbumin, Urine: <3 ug/mL

## 2024-01-08 LAB — CMP14+EGFR
ALT: 20 IU/L (ref 0–32)
AST: 18 IU/L (ref 0–40)
Albumin: 4.4 g/dL (ref 4.0–5.0)
Alkaline Phosphatase: 72 IU/L (ref 41–116)
BUN/Creatinine Ratio: 14 (ref 9–23)
BUN: 12 mg/dL (ref 6–20)
Bilirubin Total: 0.3 mg/dL (ref 0.0–1.2)
CO2: 23 mmol/L (ref 20–29)
Calcium: 9.3 mg/dL (ref 8.7–10.2)
Chloride: 103 mmol/L (ref 96–106)
Creatinine, Ser: 0.88 mg/dL (ref 0.57–1.00)
Globulin, Total: 2.3 g/dL (ref 1.5–4.5)
Glucose: 83 mg/dL (ref 70–99)
Potassium: 4.6 mmol/L (ref 3.5–5.2)
Sodium: 141 mmol/L (ref 134–144)
Total Protein: 6.7 g/dL (ref 6.0–8.5)
eGFR: 93 mL/min/1.73

## 2024-01-08 LAB — CBC
Hematocrit: 43.2 % (ref 34.0–46.6)
Hemoglobin: 13.7 g/dL (ref 11.1–15.9)
MCH: 25.3 pg — ABNORMAL LOW (ref 26.6–33.0)
MCHC: 31.7 g/dL (ref 31.5–35.7)
MCV: 80 fL (ref 79–97)
Platelets: 364 x10E3/uL (ref 150–450)
RBC: 5.41 x10E6/uL — ABNORMAL HIGH (ref 3.77–5.28)
RDW: 15.9 % — ABNORMAL HIGH (ref 11.7–15.4)
WBC: 8.7 x10E3/uL (ref 3.4–10.8)

## 2024-01-08 LAB — HEMOGLOBIN A1C
Est. average glucose Bld gHb Est-mCnc: 123 mg/dL
Hgb A1c MFr Bld: 5.9 % — ABNORMAL HIGH (ref 4.8–5.6)

## 2024-01-10 ENCOUNTER — Ambulatory Visit: Payer: Self-pay | Admitting: Family Medicine

## 2024-01-11 ENCOUNTER — Other Ambulatory Visit: Payer: Self-pay | Admitting: Family Medicine

## 2024-01-11 DIAGNOSIS — R7989 Other specified abnormal findings of blood chemistry: Secondary | ICD-10-CM

## 2024-02-17 ENCOUNTER — Other Ambulatory Visit: Payer: Self-pay

## 2024-02-17 DIAGNOSIS — L209 Atopic dermatitis, unspecified: Secondary | ICD-10-CM

## 2024-02-17 MED ORDER — DUPIXENT 300 MG/2ML ~~LOC~~ SOAJ: 300.0000 mg | SUBCUTANEOUS | 5 refills | Status: AC

## 2024-02-17 NOTE — Progress Notes (Signed)
 Refill to pharmacy and to complete new PA. aw

## 2024-08-23 ENCOUNTER — Ambulatory Visit: Admitting: Dermatology
